# Patient Record
Sex: Female | Born: 1965 | Race: Black or African American | Hispanic: No | Smoking: Never smoker
Health system: Southern US, Community
[De-identification: ages and names within clinical notes are randomized; demographics above are authoritative.]

## PROBLEM LIST (undated history)

## (undated) DIAGNOSIS — M779 Enthesopathy, unspecified: Secondary | ICD-10-CM

## (undated) DIAGNOSIS — K219 Gastro-esophageal reflux disease without esophagitis: Secondary | ICD-10-CM

## (undated) DIAGNOSIS — Z800 Family history of malignant neoplasm of digestive organs: Secondary | ICD-10-CM

## (undated) DIAGNOSIS — F419 Anxiety disorder, unspecified: Secondary | ICD-10-CM

## (undated) DIAGNOSIS — Z8041 Family history of malignant neoplasm of ovary: Secondary | ICD-10-CM

## (undated) DIAGNOSIS — Z803 Family history of malignant neoplasm of breast: Secondary | ICD-10-CM

## (undated) DIAGNOSIS — T7840XA Allergy, unspecified, initial encounter: Secondary | ICD-10-CM

## (undated) DIAGNOSIS — Z1371 Encounter for nonprocreative screening for genetic disease carrier status: Secondary | ICD-10-CM

## (undated) HISTORY — PX: TUBAL LIGATION: SHX77

## (undated) HISTORY — DX: Family history of malignant neoplasm of breast: Z80.3

## (undated) HISTORY — DX: Allergy, unspecified, initial encounter: T78.40XA

## (undated) HISTORY — PX: CHOLECYSTECTOMY: SHX55

## (undated) HISTORY — DX: Gastro-esophageal reflux disease without esophagitis: K21.9

## (undated) HISTORY — DX: Encounter for nonprocreative screening for genetic disease carrier status: Z13.71

## (undated) HISTORY — PX: TONSILLECTOMY: SUR1361

## (undated) HISTORY — DX: Anxiety disorder, unspecified: F41.9

## (undated) HISTORY — DX: Family history of malignant neoplasm of ovary: Z80.41

## (undated) HISTORY — DX: Enthesopathy, unspecified: M77.9

## (undated) HISTORY — DX: Family history of malignant neoplasm of digestive organs: Z80.0

## (undated) HISTORY — PX: GALLBLADDER SURGERY: SHX652

## (undated) HISTORY — PX: ABDOMINAL HYSTERECTOMY: SHX81

---

## 2007-09-28 ENCOUNTER — Ambulatory Visit: Payer: Self-pay | Admitting: Otolaryngology

## 2011-02-21 HISTORY — PX: ENDOMETRIAL ABLATION: SHX621

## 2011-12-30 HISTORY — PX: LAPAROSCOPIC SUPRACERVICAL HYSTERECTOMY: SUR797

## 2012-02-19 ENCOUNTER — Ambulatory Visit: Payer: Self-pay | Admitting: Obstetrics & Gynecology

## 2012-02-19 LAB — CBC
HCT: 39.3 % (ref 35.0–47.0)
HGB: 12.5 g/dL (ref 12.0–16.0)
MCH: 27.3 pg (ref 26.0–34.0)
MCV: 86 fL (ref 80–100)
RBC: 4.58 10*6/uL (ref 3.80–5.20)
RDW: 12.6 % (ref 11.5–14.5)

## 2012-02-26 ENCOUNTER — Ambulatory Visit: Payer: Self-pay | Admitting: Obstetrics & Gynecology

## 2012-02-27 LAB — PATHOLOGY REPORT

## 2012-02-27 LAB — HEMOGLOBIN: HGB: 11 g/dL — ABNORMAL LOW (ref 12.0–16.0)

## 2013-11-11 ENCOUNTER — Emergency Department: Payer: Self-pay | Admitting: Emergency Medicine

## 2013-11-11 LAB — URINALYSIS, COMPLETE
Bilirubin,UR: NEGATIVE
Blood: NEGATIVE
Ketone: NEGATIVE
Leukocyte Esterase: NEGATIVE
Nitrite: NEGATIVE
Protein: NEGATIVE
RBC,UR: 1 /HPF (ref 0–5)
Specific Gravity: 1.018 (ref 1.003–1.030)
Squamous Epithelial: 3
WBC UR: 3 /HPF (ref 0–5)

## 2013-11-11 LAB — CBC
HCT: 40 % (ref 35.0–47.0)
HGB: 13.1 g/dL (ref 12.0–16.0)
MCHC: 32.8 g/dL (ref 32.0–36.0)
MCV: 84 fL (ref 80–100)
RBC: 4.77 10*6/uL (ref 3.80–5.20)
RDW: 13.7 % (ref 11.5–14.5)

## 2013-11-11 LAB — COMPREHENSIVE METABOLIC PANEL
Albumin: 3.6 g/dL (ref 3.4–5.0)
Alkaline Phosphatase: 109 U/L (ref 50–136)
Anion Gap: 5 — ABNORMAL LOW (ref 7–16)
BUN: 13 mg/dL (ref 7–18)
Calcium, Total: 9.5 mg/dL (ref 8.5–10.1)
Chloride: 103 mmol/L (ref 98–107)
Co2: 27 mmol/L (ref 21–32)
EGFR (Non-African Amer.): >60
Glucose: 124 mg/dL — ABNORMAL HIGH (ref 65–99)
Osmolality: 272 (ref 275–301)
Potassium: 3.8 mmol/L (ref 3.5–5.1)
SGPT (ALT): 36 U/L (ref 12–78)

## 2013-11-11 LAB — DIFFERENTIAL
Basophil #: 0 10*3/uL (ref 0.0–0.1)
Basophil %: 0.2 %
Eosinophil #: 0 10*3/uL (ref 0.0–0.7)
Lymphocyte #: 1.9 10*3/uL (ref 1.0–3.6)
Monocyte #: 1 x10 3/mm — ABNORMAL HIGH (ref 0.2–0.9)
Monocyte %: 4.6 %

## 2013-11-11 LAB — TROPONIN I: Troponin-I: <0.02 ng/mL

## 2013-11-12 LAB — GC/CHLAMYDIA PROBE AMP

## 2013-11-29 ENCOUNTER — Emergency Department: Payer: Self-pay | Admitting: Emergency Medicine

## 2013-11-29 LAB — COMPREHENSIVE METABOLIC PANEL
BUN: 12 mg/dL (ref 7–18)
Bilirubin,Total: 0.3 mg/dL (ref 0.2–1.0)
Calcium, Total: 8.6 mg/dL (ref 8.5–10.1)
Chloride: 105 mmol/L (ref 98–107)
Co2: 23 mmol/L (ref 21–32)
Creatinine: 0.65 mg/dL (ref 0.60–1.30)
EGFR (Non-African Amer.): >60
Sodium: 135 mmol/L — ABNORMAL LOW (ref 136–145)
Total Protein: 7.8 g/dL (ref 6.4–8.2)

## 2013-11-29 LAB — URINALYSIS, COMPLETE
Bacteria: NONE SEEN
Bilirubin,UR: NEGATIVE
Blood: NEGATIVE
Ketone: NEGATIVE
Ph: 8 (ref 4.5–8.0)
Protein: 30
Specific Gravity: 1.028 (ref 1.003–1.030)
Squamous Epithelial: 8
WBC UR: 4 /HPF (ref 0–5)

## 2013-11-29 LAB — CBC
HCT: 36.5 % (ref 35.0–47.0)
HGB: 12.1 g/dL (ref 12.0–16.0)
MCHC: 33 g/dL (ref 32.0–36.0)
MCV: 84 fL (ref 80–100)
RBC: 4.36 10*6/uL (ref 3.80–5.20)
RDW: 14 % (ref 11.5–14.5)
WBC: 12.9 10*3/uL — ABNORMAL HIGH (ref 3.6–11.0)

## 2013-11-29 LAB — LIPASE, BLOOD: Lipase: 189 U/L (ref 73–393)

## 2013-11-30 ENCOUNTER — Ambulatory Visit: Payer: Self-pay | Admitting: Surgery

## 2013-12-01 LAB — PATHOLOGY REPORT

## 2013-12-29 DIAGNOSIS — F419 Anxiety disorder, unspecified: Secondary | ICD-10-CM

## 2013-12-29 HISTORY — DX: Anxiety disorder, unspecified: F41.9

## 2014-09-29 ENCOUNTER — Encounter: Payer: Self-pay | Admitting: Podiatry

## 2014-09-29 ENCOUNTER — Ambulatory Visit (INDEPENDENT_AMBULATORY_CARE_PROVIDER_SITE_OTHER): Payer: BC Managed Care – PPO

## 2014-09-29 ENCOUNTER — Ambulatory Visit: Payer: Self-pay | Admitting: Podiatry

## 2014-09-29 ENCOUNTER — Ambulatory Visit (INDEPENDENT_AMBULATORY_CARE_PROVIDER_SITE_OTHER): Payer: BC Managed Care – PPO | Admitting: Podiatry

## 2014-09-29 VITALS — BP 107/63 | HR 87 | Resp 16 | Ht 61.0 in | Wt 233.0 lb

## 2014-09-29 DIAGNOSIS — M722 Plantar fascial fibromatosis: Secondary | ICD-10-CM

## 2014-09-29 DIAGNOSIS — M7661 Achilles tendinitis, right leg: Secondary | ICD-10-CM

## 2014-09-29 MED ORDER — TRIAMCINOLONE ACETONIDE 10 MG/ML IJ SUSP
10.0000 mg | Freq: Once | INTRAMUSCULAR | Status: AC
  Administered 2014-09-29: 10 mg

## 2014-09-29 NOTE — Progress Notes (Signed)
Subjective:     Patient ID: Kristin Lynch, female   DOB: [birth year], 48 y.o.   MRN: [id number]  Foot Pain   patient presents stating I'm having a lot of pain in my right heel that's been present for a while and has worsened over the last month. States it's constant and that she's not sure where it has come from   Review of Systems  All other systems reviewed and are negative.      Objective:   Physical Exam  Nursing note and vitals reviewed. Constitutional: She is oriented to person, place, and time.  Cardiovascular: Intact distal pulses.   Musculoskeletal: Normal range of motion.  Neurological: She is oriented to person, place, and time.  Skin: Skin is warm.   neurovascular status is intact with muscle strength adequate and range of motion subtalar midtarsal joint was within normal limits. No equinus condition was noted and the Achilles tendon is functioning well on both right and left foot. Digits are well-perfused and arch height is normal upon weightbearing and I noted exquisite discomfort in the posterior lateral aspect of the right heel at the insertion of the Achilles tendon     Assessment:     Achilles tendinitis right with inflammation mostly in the lateral portion of the tendon    Plan:     H&P and x-rays reviewed with patient. I discussed injection therapy with immobilization and explained the risk of the procedure. Patient wants to procedure done and today I carefully injected the lateral side 3 mg Kenalog 5 mg Xylocaine and applied air fracture walker with instructions on usage. Placed on diclofenac 75 mg twice a day and reappoint in 3 weeks

## 2014-09-29 NOTE — Patient Instructions (Addendum)

## 2014-09-29 NOTE — Progress Notes (Signed)
   Subjective:    Patient ID: Kristin Lynch, female    DOB: 1966-01-23, 48 y.o.   MRN: [id number]  HPI Comments: Been having trouble with the right heel for some time now, but the last month it has got worse, its more constant.  Right lateral side of the foot has been bothering me. If moving a certain way it stops me in my tracks.   Foot Pain      Review of Systems  All other systems reviewed and are negative.      Objective:   Physical Exam        Assessment & Plan:

## 2014-10-02 ENCOUNTER — Telehealth: Payer: Self-pay | Admitting: *Deleted

## 2014-10-02 NOTE — Telephone Encounter (Signed)
I'm calling about a prescription, I was there Friday and saw Dr. Paulla Dolly. He was calling an a non-inflammatory medication for me to [facility] on [address]. When I got there on Saturday they didn't have a record of it being there. If you guys would let me know what I need to do to go ahead and get that there and get it picked up. Thank you.   Told pt i would call her once rx has been sent over. Pt understood.

## 2014-10-02 NOTE — Telephone Encounter (Signed)
I'm calling about a prescription, I was there Friday and saw Dr. Paulla Dolly. He was calling an a non-inflammatory medication for me to [facility] on [address]. When I got there on Saturday they didn't have a record of it being there. If you guys would let me know what I need to do to go ahead and get that there and get it picked up. Thank you.

## 2014-10-02 NOTE — Telephone Encounter (Signed)
I'm calling about a prescription, I was there Friday and saw Dr. Paulla Dolly.  He was calling an a non-inflammatory medication for me to [facility] on [address].  When I got there on Saturday they didn't have a record of it being there.  If you guys would let me know what I need to do to go ahead and get that there and get it picked up.  Thank you.

## 2014-10-03 ENCOUNTER — Other Ambulatory Visit: Payer: Self-pay | Admitting: *Deleted

## 2014-10-03 ENCOUNTER — Telehealth: Payer: Self-pay | Admitting: *Deleted

## 2014-10-03 MED ORDER — DICLOFENAC SODIUM 75 MG PO TBEC: 75.0000 mg | DELAYED_RELEASE_TABLET | Freq: Two times a day (BID) | ORAL | Status: DC

## 2014-10-03 NOTE — Telephone Encounter (Signed)
Pt called wanting to know if dr regal had sent her rx over. Told pt dr Paulla Dolly had not sent rx over and we will call her once he does so. Pt understood.

## 2014-10-03 NOTE — Telephone Encounter (Signed)
Called and spoke with pt letting her know diclofenac was sent to pharmacy. Pt understood.

## 2014-10-03 NOTE — Telephone Encounter (Signed)
Check her chart and lets talk tomorrow about which. Probably diclofenec

## 2014-10-06 NOTE — Telephone Encounter (Signed)
diclofence #[id number]

## 2014-10-17 ENCOUNTER — Ambulatory Visit (INDEPENDENT_AMBULATORY_CARE_PROVIDER_SITE_OTHER): Payer: BC Managed Care – PPO | Admitting: Podiatry

## 2014-10-17 DIAGNOSIS — M7661 Achilles tendinitis, right leg: Secondary | ICD-10-CM

## 2014-10-17 DIAGNOSIS — M79609 Pain in unspecified limb: Secondary | ICD-10-CM

## 2014-10-18 NOTE — Progress Notes (Signed)
Subjective:     Patient ID: Kristin Lynch, female   DOB: 08-28-1966, 48 y.o.   MRN: [id number]  HPI patient presents stating my foot is feeling a lot better with mild discomfort when pressed deep   Review of Systems     Objective:   Physical Exam Neurovascular status intact with significant diminishment of discomfort in the plantar foot heel with palpation and ambulation    Assessment:     Improved fasciitis tendinitis    Plan:     Discussed Achilles tendon and fasciitis-like exercises along with ice and dispensed heel cups to lift the heels and continued diclofenac for several more weeks. Reappoint if symptoms persist

## 2014-10-20 ENCOUNTER — Ambulatory Visit: Payer: BC Managed Care – PPO | Admitting: Podiatry

## 2015-04-20 NOTE — Op Note (Signed)
PATIENT NAME:  Kristin Lynch, Kristin Lynch MR#:  [id number] DATE OF BIRTH:  02-23-1966  DATE OF PROCEDURE:  11/30/2013  PREOPERATIVE DIAGNOSIS: Symptomatic cholelithiasis.   POSTOPERATIVE DIAGNOSIS: Symptomatic cholelithiasis.   PROCEDURE PERFORMED: Laparoscopic cholecystectomy.   ESTIMATED BLOOD LOSS:  5 mL.   COMPLICATIONS: None.   SPECIMEN: Gallbladder.   INDICATION FOR SURGERY:  Kristin Lynch is a pleasant 49 year old female who presented with recurrent right upper quadrant pain, particularly with fatty food. She was noted to have gallstones. I felt her presentation was consistent with symptomatic cholelithiasis.   DETAILS OF PROCEDURE: After informed consent was obtained, Kristin Lynch was brought to the operating room suite. She was induced. Endotracheal tube was placed. General anesthesia was administered. Her abdomen was prepped and draped in standard surgical fashion. A timeout was then performed correctly identifying the patient name, operative site and procedure to be performed. A supraumbilical incision was made. It was deepened down to the fascia and the fascia was incised. The peritoneum was entered. Two stay sutures were placed through the fascia.  A hassan trocar was placed through the fasciotomy into the abdomen. The abdomen was insufflated. An 11 mm epigastric trocar and two 5 mm subcostal trocars were placed at the midclavicular and anterior axillary line. The gallbladder was then lifted up over the dome of the liver. There were some adhesions that needed to be taken off the gallbladder. These were taken down bluntly, and the cystic artery and cystic duct were dissected out. The critical view was obtained. The cystic duct was clipped 3 times and ligated. Likewise, the cystic artery was clipped 3 times and ligated. The gallbladder was then taken off the gallbladder fossa using cautery. The gallbladder was then taken out through an Endo Catch bag through the supraumbilical port. The gallbladder fossa  was examined and noted to be hemostatic. The abdomen was then irrigated with normal saline to rid of any bile. The trocars were then taken out under direct visualization. The abdomen was desufflated. The 0 Vicryl figure-of-eight was used to close the supraumbilical fascia. The wounds were then closed with 4-0 Monocryl deep dermal sutures. Steri-Strips, Telfa gauze and Tegaderm were then used to complete the dressing. The patient was then awoken, extubated and brought to the postanesthesia care unit. There were no immediate complications. Needle, sponge and instrument counts were correct at the end of the procedure.    ____________________________ Glena Norfolk. Nonnie Pickney, MD cal:dmm D: 11/30/2013 11:14:04 ET T: 11/30/2013 11:23:39 ET JOB#: [id number]  cc: Harrell Gave A. Bence Trapp, MD, <Dictator> Floyde Parkins MD ELECTRONICALLY SIGNED 12/08/2013 11:39

## 2015-04-22 NOTE — Op Note (Signed)
PATIENT NAME:  Kristin Lynch, Kristin Lynch MR#:  [id number] DATE OF BIRTH:  06/10/1966  DATE OF PROCEDURE:  02/26/2012  PREOPERATIVE DIAGNOSES: Pelvic pain and menorrhagia.   POSTOPERATIVE DIAGNOSIS: Pelvic pain and menorrhagia.   PROCEDURE: Laparoscopic supracervical hysterectomy.   SURGEON: Barnett Applebaum, M.D.   ASSISTANT: Verlene Mayer M.D.   ANESTHESIA: General.   ESTIMATED BLOOD LOSS: 50 mL.   COMPLICATIONS: None.   FINDINGS: Somewhat enlarged boggy uterus, normal ovaries. There were adhesions in the left adnexal region.   DISPOSITION: To the Recovery Room in stable condition.   TECHNIQUE: The patient is prepped and draped in the usual sterile fashion after adequate anesthesia is obtained, in the dorsal lithotomy position. Foley catheter is inserted along with the sponge stick per vagina for manipulation purposes. Attention is then turned to the abdomen where a Veress needle is inserted through a 5 mm infraumbilical incision after Marcaine is used to anesthetize the skin. Veress needle placement is confirmed using the hanging drop technique and the abdomen is then insufflated with CO2 gas. A 5 mm trocar is then inserted under direct visualization with the laparoscope with no injuries or bleeding noted. The patient is placed in Trendelenburg positioning. A 5 mm trocar is placed in the left lower quadrant and an 11 mm trocar is placed in the right lower quadrant lateral to the inferior epigastric blood vessels with no injuries or bleeding noted. The uterus is grasped with a tenaculum and the uterine ovarian blood vessels and ligaments are carefully coagulated and cut using the Harmonic scalpel with additional cautery using the bipolar cautery device with preservation of the ovary and its main blood supply. The dissection is carried down to the level of the uterine arteries, which are carefully coagulated and cut. Adhesions in the left adnexa are dissected for easier visualization and completion of the  procedure. The uterus is then amputated with approximately 1 cm of cervix left. Hemostasis is assured using Bovie electrocautery. The pelvic cavity is irrigated with saline, and the endocervical canal is cauterized.   A morcellator device is placed over the right lower quadrant incision and the uterus is removed by morcellation process without complication. Repeat examination of the pelvis reveals excellent hemostasis and no apparent injury to bowel, ureter, or other structures. Interceed is placed over the cervical remnant to minimize adhesion formation. The right lower quadrant incision is closed with a 0 Vicryl suture, using a fascial closure device, gas is expelled, and trocars are removed, and the skin is closed with Dermabond. Sponge stick is removed from the vagina, and the Foley catheter is left in place. The patient goes to the Recovery Room in stable condition having tolerated the procedure well. All sponge, instrument, and needle counts are correct. ____________________________ R. Barnett Applebaum, MD rph:slb D: 02/26/2012 12:38:04 ET T: 02/26/2012 12:45:30 ET JOB#: [id number]  cc: Glean Salen, MD, <Dictator> Gae Dry MD ELECTRONICALLY SIGNED 02/26/2012 14:11

## 2015-11-02 ENCOUNTER — Ambulatory Visit (INDEPENDENT_AMBULATORY_CARE_PROVIDER_SITE_OTHER): Payer: BLUE CROSS/BLUE SHIELD | Admitting: Sports Medicine

## 2015-11-02 ENCOUNTER — Encounter: Payer: Self-pay | Admitting: Sports Medicine

## 2015-11-02 DIAGNOSIS — M79672 Pain in left foot: Secondary | ICD-10-CM | POA: Diagnosis not present

## 2015-11-02 DIAGNOSIS — M216X1 Other acquired deformities of right foot: Secondary | ICD-10-CM | POA: Diagnosis not present

## 2015-11-02 DIAGNOSIS — M79671 Pain in right foot: Secondary | ICD-10-CM

## 2015-11-02 DIAGNOSIS — M7662 Achilles tendinitis, left leg: Secondary | ICD-10-CM | POA: Diagnosis not present

## 2015-11-02 DIAGNOSIS — M7661 Achilles tendinitis, right leg: Secondary | ICD-10-CM

## 2015-11-02 DIAGNOSIS — M216X2 Other acquired deformities of left foot: Secondary | ICD-10-CM | POA: Diagnosis not present

## 2015-11-02 MED ORDER — METHYLPREDNISOLONE 4 MG PO TBPK: ORAL_TABLET | ORAL | Status: DC

## 2015-11-02 NOTE — Progress Notes (Signed)
Patient ID: Kristin Lynch, female   DOB: 02-25-1966, 49 y.o.   MRN: [id number] Subjective: Kristin Lynch is a 49 y.o. female patient who presents to office for evaluation of Right> Left foot pain. Patient complains of progressive pain especially over the last year in the Right>Left foot at the Achilles. Patient has tried injections, heel cups/lifts, diclofenac (adverse reaction of itchiness), CAM walker, & ice with no relief in symptoms. Reports that right heel has been bothersome for 1 year and left heel for a few months; starts as a dull ache made worse with walking and better with high heels. Patient denies any other pedal complaints.   There are no active problems to display for this patient.  Current Outpatient Prescriptions on File Prior to Visit  Medication Sig Dispense Refill  . diclofenac (VOLTAREN) 75 MG EC tablet Take 1 tablet (75 mg total) by mouth 2 (two) times daily. 60 tablet 1  . fluticasone (FLONASE) 50 MCG/ACT nasal spray Place into both nostrils daily.     No current facility-administered medications on file prior to visit.   No Known Allergies   Objective:  General: Alert and oriented x3 in no acute distress  Dermatology: No open lesions bilateral lower extremities, no webspace macerations, no ecchymosis bilateral, all nails x 10 are well manicured.  Vascular: Dorsalis Pedis and Posterior Tibial pedal pulses 2/4, Capillary Fill Time 3 seconds, + pedal hair growth bilateral, no edema bilateral lower extremities, Temperature gradient within normal limits.  Neurology: Johney Maine sensation intact via light touch bilateral, Protective sensation intact  with Thornell Mule Monofilament to all pedal sites, Position sense intact, vibratory intact bilateral, Deep tendon reflexes within normal limits bilateral, No babinski sign present bilateral. - Tinels sign bilateral.   Musculoskeletal: Mild tenderness with palpation at insertion of the Achilles on Right>Left, there is  calcaneal exostosis with mild soft tissue present and decreased ankle rom with knee extending  vs flexed resembling gastroc equnius bilateral, The achilles tendon feels intact with no nodularity or palpable dell, Thompson sign negative, Subtalar and midtarsal joint range of motion is within normal limits, there is no 1st ray hypermobility or forefoot deformity noted bilateral.   Gait: Antalgic gait with increased heel off right>left.   Assessment and Plan: Problem List Items Addressed This Visit    None    Visit Diagnoses    Achilles tendonitis, bilateral    -  Primary    Right >1 year, Left a few months    Relevant Medications    methylPREDNISolone (MEDROL DOSEPAK) 4 MG TBPK tablet    Acquired equinus deformity of both feet        Heel pain, bilateral        Relevant Medications    methylPREDNISolone (MEDROL DOSEPAK) 4 MG TBPK tablet      -Complete examination performed -Previous Xrays reviewed -Discussed treatement options -Rx Medrol dose pack once complete to start Aleve or motrin as needed -Dispensed night splint of which she will start using. -Continue with heel lifts, gentle stretching, ice, and CAM walker as needed for acute exacerbations.  -If no improvement will consider repeat xrays/MRI/PT/EPAT -Patient to return to office in 3 weeks or sooner if condition worsens.  Landis Martins, DPM

## 2015-11-02 NOTE — Patient Instructions (Signed)
Achilles Tendon Rupture With Phase I Rehab A complete tear in the Achilles tendon is known as an Achilles tendon rupture. The Achilles tendon, which is also known as the heel cord, connects the large calf muscles (gastrocnemius and soleus) to the heel bone (calcaneus) and is essential for proper functioning of the calf muscles. The calf muscles are required for pushing the foot downward and are necessary for walking, running, and jumping. SYMPTOMS   A "pop" or tear may be felt at the time of injury.  Pain and weakness during movement, especially when raising the heel.  Tenderness, swelling, warmth, and redness around the Achilles tendon.  Bruising (contusion) around the back of the ankle after 48 hours.  Loss of firmness to the touch over the area of the Achilles tendon that ruptured. CAUSES   Achilles tendon rupture is most commonly caused by a sudden force placed upon the Achilles tendon that is greater than the tendon can withstand (for example jumping, hurdling, or sprinting).  Achilles tendon rupture may also occur from direct trauma or injury to the lower leg, foot, or ankle. RISK INCREASES WITH:  Physical activity that involves sudden muscle contraction.  Poor strength and flexibility of the lower leg.  Previous injury to the tendon.  Untreated Achilles tendinitis.  Steroid injection into the Achilles tendon.  Medical conditions, such as poor circulation due to a cardiovascular condition or obesity. PREVENTION   Include a proper warm-up and stretching routine before physical activity.  Allow for rest and recovery between physical activity.  Maintain lower leg fitness:  Flexibility.  Muscle strength.  Muscular endurance.  Cardiovascular fitness.  Mechanical prevention:  Taping.  Protective strapping.  An adhesive bandaging that has been recommended prior to physical activity. TREATMENT  Initially one should not walk on the affected leg. One should also ice  the injured area, apply compression with an elastic bandage, and elevate the injured leg to eye level. Both surgical and nonsurgical interventions exist for definitive treatment. The return to sports is usually about the same with either treatment course but can occur a few weeks sooner with surgery.   Nonsurgical treatment typically requires the immobilization in the form of a long leg cast (from toes to groin) for 4 to 9 weeks. The long leg cast is followed by a short leg cast or walking boot for an additional 4 to 12 weeks.  Advantages: Non-surgical treatment lacks the risks involved with anesthesia or surgery (such as infection, bleeding, or injury to nerves).  Disadvantages: Non-surgical treatment involves a longer period of immobilization. This may result in stiffer ankle and knee joints. Also, the calf muscles are slightly weaker and the risk of repeat rupture is higher.  Surgical treatment typically requires sewing the ends of the tendon back together, followed by immobilization (typically a short leg cast or walking boot).  Advantages: Surgical treatment does not usually require the immobilization of the knee. Surgery also offers a lower risk of repeat rupture of the tendon and slightly stronger calf muscles.  Disadvantages: Surgical treatment does include the risks of anesthesia and surgery. These risks include impaired wound healing and injury to a nerve that provides sensation to the side of the foot. PROGNOSIS   Achilles tendon ruptures are typically curable if treated correctly.  A period of 4 to 9 months is typical before a return to sports. RELATED COMPLICATIONS   Calf muscle weakness may occur, especially if the rupture goes untreated.  The possibility of repeat rupture exists despite treatment.  Prolonged disability can occur.  Risks of surgery include infection, bleeding, injury to nerves, and impaired wound healing. MEDICATION   If pain medication is necessary, then  nonsteroidal anti-inflammatory medications, such as aspirin and ibuprofen, or other minor pain relievers, such as acetaminophen, are often recommended.  Do not take pain medication within 7 days before surgery.  All medications should be taken under the direction of your caregiver. Contact your caregiver immediately if any bleeding, stomach upset, or signs of allergic reaction occur.  Prescription pain medication may be prescribed as necessary by your caregiver. Use only as directed and only as much as you need. SEEK MEDICAL CARE IF:   Pain continues to increase, despite treatment.  Cast discomfort develops.  New, unexplained symptoms develop.  You are experiencing any side effects form the drugs used in treatment. EXERCISES  PHASE I EXERCISES RANGE OF MOTION (ROM) AND STRETCHING EXERCISES - Achilles Tendon Rupture Phase I  These exercises will help you begin to restore your ankle flexibility in the first 2 to 4 weeks after your cast is removed. Your physician, physical therapist, or athletic trainer will progress your exercise once you have demonstrated the sufficient gains in flexibility and strength. While completing these exercises, remember:   Restoring tissue flexibility helps normal motion to return to the joints. This allows healthier, less painful movement and activity.  An effective stretch should be held for at least 30 seconds.  A stretch should never be painful. You should only feel a gentle lengthening or release in the stretched tissue. RANGE OF MOTION - Dorsi/Plantar Flexion  While sitting with your right / left knee straight, draw the top of your foot upwards by flexing your ankle. Then reverse the motion, pointing your toes downward.  Hold each position for __________ seconds.  After completing your first set of exercises, repeat this exercise with your knee bent. Repeat __________ times. Complete this exercise __________ times per day.  RANGE OF MOTION - Ankle  Alphabet  Imagine your right / left big toe is a pen.  Keeping your hip and knee still, write out the entire alphabet with your "pen." Make the letters as large as you can without increasing any discomfort. Repeat __________ times. Complete this exercise __________ times per day.  RANGE OF MOTION - Ankle Dorsiflexion, Active Assisted   Remove shoes and sit on a chair that is preferably not on a carpeted surface.  Place right / left foot under knee. Extend your opposite leg for support.  Keeping your heel down, slide your right / left foot back toward the chair until you feel a stretch at your ankle or calf. If you do not feel a stretch, slide your bottom forward to the edge of the chair, while still keeping your heel down.  Hold this stretch for __________ seconds. Repeat __________ times. Complete this stretch __________ times per day.  STRETCH - Gastrocsoleus   Sit with your right / left leg extended. Holding onto both ends of a belt or towel, loop it around the ball of your foot.  Keeping your right / left ankle and foot relaxed and your knee straight, pull your foot and ankle toward you using the belt/towel.  You should feel a gentle stretch behind your calf or knee. Hold this position for __________ seconds. Repeat __________ times. Complete this stretch __________ times per day.  STRENGTHENING EXERCISES - Achilles Tendon Rupture Phase I  These exercises will help you begin to restore your ankle strength the first 2 to  4 weeks after your cast is removed. Your physician, physical therapist, or athletic trainer will progress your exercise once you have demonstrated the sufficient gains in flexibility and strength. While completing these exercises, remember:   Muscles can gain both the endurance and the strength needed for everyday activities through controlled exercises.  Complete these exercises as instructed by your physician, physical therapist, or athletic trainer. Progress the  resistance and repetitions only as guided.  You may experience muscle soreness or fatigue, but the pain or discomfort you are trying to eliminate should never worsen during these exercises. If this pain does worsen, stop and make certain you are following the directions exactly. If the pain is still present after adjustments, discontinue the exercise until you can discuss the trouble with your clinician. STRENGTH - Dorsiflexors  Secure a rubber exercise band/tubing to a fixed object (for example a table or pole) and loop the other end around your right / left foot.  Sit on the floor facing the fixed object. The band/tubing should be slightly tense when your foot is relaxed.  Slowly draw your foot back toward you using your ankle and toes.  Hold this position for __________ seconds. Slowly release the tension in the band and return your foot to the starting position. Repeat __________ times. Complete this exercise __________ times per day.  STRENGTH - Plantar-flexors   Sit with your right / left leg extended. Holding onto both ends of a rubber exercise band/tubing, loop it around the ball of your foot. Keep a slight tension in the band.  Slowly push your toes away from you, pointing them downward.  Hold this position for __________ seconds. Return slowly, controlling the tension in the band/tubing. Repeat __________ times. Complete this exercise __________ times per day.  STRENGTH - Towel Curls  Sit in a chair positioned on a non-carpeted surface.  Place your foot on a towel, keeping your heel on the floor.  Pull the towel toward your heel by only curling your toes. Keep your heel on the floor.  If instructed by your physician, physical therapist or athletic trainer, add weight to the end of the towel. Repeat __________ times. Complete this exercise __________ times per day. STRENGTH - Ankle Eversion   Secure one end of a rubber exercise band/tubing to a fixed object (table, pole).  Loop the other end around your foot just before your toes.  Place your fists between your knees. This will focus your strengthening at your ankle.  Drawing the band/tubing across your opposite foot, slowly, pull your little toe out and up. Make sure the band/tubing is positioned to resist the entire motion.  Hold this position for __________ seconds.  Have your muscles resist the band/tubing as it slowly pulls your foot back to the starting position. Repeat __________ times. Complete this exercise __________ times per day.  STRENGTH - Ankle Inversion  Secure one end of a rubber exercise band/tubing to a fixed object (table, pole). Loop the other end around your foot just before your toes.  Place your fists between your knees. This will focus your strengthening at your ankle.  Slowly, pull your big toe up and in, making sure the band/tubing is positioned to resist the entire motion.  Hold this position for __________ seconds.  Have your muscles resist the band/tubing as it slowly pulls your foot back to the starting position. Repeat __________ times. Complete this exercises __________ times per day.  STRENGTH - Plantar Flexors, Seated   Sit on a chair  that allows your feet to rest flat on the ground. If necessary, sit at the edge of the chair.  Keeping your toes firmly on the ground, lift your right / left heel as far as you can without increasing any discomfort in your ankle. Repeat __________ times. Complete this exercise __________ times a day. *If instructed by your physician, physical therapist, or athletic trainer, you may add ____________________ of resistance by placing a weighted object on your knee.   This information is not intended to replace advice given to you by your health care provider. Make sure you discuss any questions you have with your health care provider.   Document Released: 12/15/2005 Document Revised: 05/01/2015 Document Reviewed: 03/29/2009 [employer]  Interactive Patient Education [employer].

## 2015-11-20 ENCOUNTER — Ambulatory Visit (INDEPENDENT_AMBULATORY_CARE_PROVIDER_SITE_OTHER): Payer: BLUE CROSS/BLUE SHIELD | Admitting: Sports Medicine

## 2015-11-20 ENCOUNTER — Encounter: Payer: Self-pay | Admitting: Sports Medicine

## 2015-11-20 DIAGNOSIS — M7662 Achilles tendinitis, left leg: Secondary | ICD-10-CM | POA: Diagnosis not present

## 2015-11-20 DIAGNOSIS — M79672 Pain in left foot: Secondary | ICD-10-CM | POA: Diagnosis not present

## 2015-11-20 DIAGNOSIS — M216X1 Other acquired deformities of right foot: Secondary | ICD-10-CM

## 2015-11-20 DIAGNOSIS — M216X2 Other acquired deformities of left foot: Secondary | ICD-10-CM

## 2015-11-20 DIAGNOSIS — M7661 Achilles tendinitis, right leg: Secondary | ICD-10-CM | POA: Diagnosis not present

## 2015-11-20 DIAGNOSIS — M79671 Pain in right foot: Secondary | ICD-10-CM | POA: Diagnosis not present

## 2015-11-20 NOTE — Progress Notes (Signed)
Patient ID: Kristin Lynch, female   DOB: Jun 15, 1966, 49 y.o.   MRN: [id number]  Subjective: Kristin Lynch is a 49 y.o. female patient who returns to office for evaluation of Right> Left foot pain. Patient reports that the pain is better in Left however there is residual pain in the right with a little bit of lateral heel numbness possibly related to night splint use. Patient was able to tolerate Steroid dose pack and Voltaren with no problems; admits to irritability that improved when finished the dose pack. Patient denies any other pedal complaints.   There are no active problems to display for this patient.  Current Outpatient Prescriptions on File Prior to Visit  Medication Sig Dispense Refill  . diclofenac (VOLTAREN) 75 MG EC tablet Take 1 tablet (75 mg total) by mouth 2 (two) times daily. 60 tablet 1  . fluticasone (FLONASE) 50 MCG/ACT nasal spray Place into both nostrils daily.     No current facility-administered medications on file prior to visit.   No Known Allergies   Objective:  General: Alert and oriented x3 in no acute distress  Dermatology: No open lesions bilateral lower extremities, no webspace macerations, no ecchymosis bilateral, all nails x 10 are well manicured.  Vascular: Dorsalis Pedis and Posterior Tibial pedal pulses 2/4, Capillary Fill Time 3 seconds, + pedal hair growth bilateral, no edema bilateral lower extremities, Temperature gradient within normal limits.  Neurology: Johney Maine sensation intact via light touch bilateral, Protective sensation intact  with Thornell Mule Monofilament to all pedal sites, Position sense intact, vibratory intact bilateral, Deep tendon reflexes within normal limits bilateral, No babinski sign present bilateral. - Tinels sign bilateral.   Musculoskeletal: Decreased tenderness with palpation at insertion of the Achilles on Left>Right, there is calcaneal exostosis with mild soft tissue present and decreased ankle rom with knee  extending  vs flexed resembling gastroc equnius bilateral, The achilles tendon feels intact with no nodularity or palpable dell, Thompson sign negative, Subtalar and midtarsal joint range of motion is within normal limits, there is no 1st ray hypermobility or forefoot deformity noted bilateral.   Gait: Non-Antalgic gait with increased heel off right>left.   Assessment and Plan: Problem List Items Addressed This Visit    None    Visit Diagnoses    Achilles tendonitis, bilateral    -  Primary    R>L    Acquired equinus deformity of both feet        Heel pain, bilateral          -Complete examination performed -Discussed treatement options  -Continue night splint with use every other night to prevent transient neuritis at right heel -Continue with heel lifts, gentle stretching, ice, and CAM walker as needed for acute exacerbations. -Rx PT with range and topical modalities at [facility].   -If no improvement will consider repeat xrays/MRI/EPAT -Patient to return to office in 3 weeks or sooner if condition worsens.  Landis Martins, DPM

## 2015-12-11 ENCOUNTER — Ambulatory Visit: Payer: BLUE CROSS/BLUE SHIELD | Admitting: Sports Medicine

## 2016-01-04 ENCOUNTER — Encounter: Payer: Self-pay | Admitting: Sports Medicine

## 2016-01-04 ENCOUNTER — Ambulatory Visit (INDEPENDENT_AMBULATORY_CARE_PROVIDER_SITE_OTHER): Payer: BLUE CROSS/BLUE SHIELD | Admitting: Sports Medicine

## 2016-01-04 DIAGNOSIS — M7661 Achilles tendinitis, right leg: Secondary | ICD-10-CM | POA: Diagnosis not present

## 2016-01-04 DIAGNOSIS — M79671 Pain in right foot: Secondary | ICD-10-CM

## 2016-01-04 DIAGNOSIS — M216X2 Other acquired deformities of left foot: Secondary | ICD-10-CM | POA: Diagnosis not present

## 2016-01-04 DIAGNOSIS — M7662 Achilles tendinitis, left leg: Secondary | ICD-10-CM

## 2016-01-04 DIAGNOSIS — M779 Enthesopathy, unspecified: Secondary | ICD-10-CM

## 2016-01-04 DIAGNOSIS — M216X1 Other acquired deformities of right foot: Secondary | ICD-10-CM | POA: Diagnosis not present

## 2016-01-04 DIAGNOSIS — M79672 Pain in left foot: Secondary | ICD-10-CM

## 2016-01-04 MED ORDER — TRIAMCINOLONE ACETONIDE 10 MG/ML IJ SUSP: 10.0000 mg | Freq: Once | INTRAMUSCULAR | Status: DC

## 2016-01-04 MED ORDER — DICLOFENAC SODIUM 75 MG PO TBEC: 75.0000 mg | DELAYED_RELEASE_TABLET | Freq: Two times a day (BID) | ORAL | Status: DC

## 2016-01-04 NOTE — Progress Notes (Signed)
Patient ID: Kristin Lynch, female   DOB: 1966/02/26, 50 y.o.   MRN: [id number]  Subjective: Kristin Lynch is a 50 y.o. female patient who returns to office for evaluation of Bilateral foot pain at heels; States that the right side was worse and is doing much better after therapy however there is still residual pain in the left side; states that she feels like therapy helped a lot. Patient states that she aggerviated her heels on yesterday by wearing a pair of boots that were to tight with orthotic in them. Patient denies any other pedal complaints.   There are no active problems to display for this patient.  Current Outpatient Prescriptions on File Prior to Visit  Medication Sig Dispense Refill  . fluticasone (FLONASE) 50 MCG/ACT nasal spray Place into both nostrils daily.     No current facility-administered medications on file prior to visit.   No Known Allergies   Objective:  General: Alert and oriented x3 in no acute distress  Dermatology: No open lesions bilateral lower extremities, no webspace macerations, no ecchymosis bilateral, all nails x 10 are well manicured.  Vascular: Dorsalis Pedis and Posterior Tibial pedal pulses 2/4, Capillary Fill Time 3 seconds, + pedal hair growth bilateral, no edema bilateral lower extremities, Temperature gradient within normal limits.  Neurology: Johney Maine sensation intact via light touch bilateral, Protective sensation intact  with Thornell Mule Monofilament to all pedal sites, Position sense intact, vibratory intact bilateral, Deep tendon reflexes within normal limits bilateral, No babinski sign present bilateral. - Tinels sign bilateral.   Musculoskeletal: Decreased tenderness with palpation at insertion of the Achilles on Right>Left, New pain at peroneal tendons bilateral with deep palpation, there is calcaneal exostosis with mild soft tissue present and decreased ankle rom with knee extending  vs flexed resembling gastroc equnius bilateral,  The achilles tendon feels intact with no nodularity or palpable dell, Thompson sign remains negative, Subtalar and midtarsal joint range of motion is within normal limits, there is no 1st ray hypermobility or forefoot deformity noted bilateral.  Assessment and Plan: Problem List Items Addressed This Visit    None    Visit Diagnoses    Achilles tendonitis, bilateral    -  Primary    Relevant Medications    diclofenac (VOLTAREN) 75 MG EC tablet    Acquired equinus deformity of both feet        Relevant Medications    diclofenac (VOLTAREN) 75 MG EC tablet    Heel pain, bilateral        Relevant Medications    diclofenac (VOLTAREN) 75 MG EC tablet    Tendonitis        Relevant Medications    diclofenac (VOLTAREN) 75 MG EC tablet    triamcinolone acetonide (KENALOG) 10 MG/ML injection 10 mg (Start on 01/04/2016  9:45 AM)      -Complete examination performed -Discussed treatement options  -After oral consent and aseptic prep, injected a mixture containing 1 ml of 2%  plain lidocaine, 1 ml 0.5% plain marcaine, 0.5 ml of kenalog 10 and 0.5 ml of dexamethasone phosphate into peroneal tendon sheath bilateral. Post-injection care discussed with patient.  -Refilled Diclofenac -Continue night splint with use every other night  -Continue with heel lifts, gentle stretching, ice, and good supportive shoes. Advised use of CAM walker as needed for acute exacerbations.  -If no improvement will consider repeat xrays/MRI/EPAT; gave brochure -Patient to return to office in 3 weeks or sooner if condition worsens.  Landis Martins, DPM

## 2016-01-25 ENCOUNTER — Ambulatory Visit: Payer: BLUE CROSS/BLUE SHIELD | Admitting: Sports Medicine

## 2017-11-28 DIAGNOSIS — Z8041 Family history of malignant neoplasm of ovary: Secondary | ICD-10-CM

## 2017-11-28 DIAGNOSIS — Z803 Family history of malignant neoplasm of breast: Secondary | ICD-10-CM

## 2017-11-28 DIAGNOSIS — Z1371 Encounter for nonprocreative screening for genetic disease carrier status: Secondary | ICD-10-CM

## 2017-11-28 DIAGNOSIS — Z800 Family history of malignant neoplasm of digestive organs: Secondary | ICD-10-CM

## 2017-11-28 HISTORY — DX: Family history of malignant neoplasm of digestive organs: Z80.0

## 2017-11-28 HISTORY — DX: Family history of malignant neoplasm of ovary: Z80.41

## 2017-11-28 HISTORY — DX: Encounter for nonprocreative screening for genetic disease carrier status: Z13.71

## 2017-11-28 HISTORY — DX: Family history of malignant neoplasm of breast: Z80.3

## 2017-12-18 ENCOUNTER — Encounter: Payer: Self-pay | Admitting: Certified Nurse Midwife

## 2017-12-18 ENCOUNTER — Ambulatory Visit (INDEPENDENT_AMBULATORY_CARE_PROVIDER_SITE_OTHER): Payer: BLUE CROSS/BLUE SHIELD | Admitting: Certified Nurse Midwife

## 2017-12-18 VITALS — BP 112/72 | HR 80 | Ht 61.0 in | Wt 237.0 lb

## 2017-12-18 DIAGNOSIS — Z1231 Encounter for screening mammogram for malignant neoplasm of breast: Secondary | ICD-10-CM | POA: Diagnosis not present

## 2017-12-18 DIAGNOSIS — Z01419 Encounter for gynecological examination (general) (routine) without abnormal findings: Secondary | ICD-10-CM

## 2017-12-18 DIAGNOSIS — Z1239 Encounter for other screening for malignant neoplasm of breast: Secondary | ICD-10-CM

## 2017-12-18 DIAGNOSIS — Z8041 Family history of malignant neoplasm of ovary: Secondary | ICD-10-CM | POA: Diagnosis not present

## 2017-12-18 DIAGNOSIS — Z1211 Encounter for screening for malignant neoplasm of colon: Secondary | ICD-10-CM | POA: Diagnosis not present

## 2017-12-18 DIAGNOSIS — Z124 Encounter for screening for malignant neoplasm of cervix: Secondary | ICD-10-CM

## 2017-12-18 DIAGNOSIS — Z803 Family history of malignant neoplasm of breast: Secondary | ICD-10-CM

## 2017-12-18 NOTE — Progress Notes (Signed)
Gynecology Annual Exam  PCP: System, Provider Not In  Chief Complaint:  Chief Complaint  Patient presents with  . Gynecologic Exam    History of Present Illness: Kristin Lynch is a 51 y.o. BF, G3 P2012, who presents for a gyn exam. The patient has no significant gyn concerns. Her menses are absent due to a [facility] in 2013 (AUB, pelvic pain-adenomyosis). She has had no spotting Last pap smear: 09/11/2010, results were NIL/negative HRHPV   The patient is sexually active.. She does not have dyspareunia.  Since her hysterectomy in 2013, she has been seen at [facility] for situatonal anxiety (no longer on meds) and a Bartholin abscess in Dec 2015.  Her past medical history is remarkable for allergic rhinitis and obesity.  The patient does perform self breast exams. Her last mammogram was 10/02/2015, results were negative.   There is a family history of breast cancer in her mother Genetic testing has not been done.   There is a family history of ovarian cancer in her paternal grandmother Genetic testing has not been done.  The patient denies smoking.  She reports drinking alcohol on occasion   She denies illegal drug use.  The patient reports exercising occasionally.  The patient denies current symptoms of depression.    She had cholesterol screening in 2016 and it was normal.  Review of Systems: Review of Systems  Constitutional: Negative for chills, fever and weight loss.  HENT: Negative for congestion, sinus pain and sore throat.   Eyes: Negative for blurred vision and pain.  Respiratory: Negative for hemoptysis, shortness of breath and wheezing.   Cardiovascular: Negative for chest pain, palpitations and leg swelling.  Gastrointestinal: Negative for abdominal pain, blood in stool, diarrhea, heartburn, nausea and vomiting.  Genitourinary: Negative for dysuria, frequency, hematuria and urgency.  Musculoskeletal: Negative for back pain, joint pain and myalgias.  Skin:  Negative for itching and rash.  Neurological: Negative for dizziness, tingling and headaches.  Endo/Heme/Allergies: Negative for environmental allergies and polydipsia. Does not bruise/bleed easily.       Negative for hirsutism   Psychiatric/Behavioral: Negative for depression. The patient is not nervous/anxious and does not have insomnia.     Past Medical History:  Past Medical History:  Diagnosis Date  . Allergy   . Anxiety 2015  . Tendonitis     Past Surgical History:  Past Surgical History:  Procedure Laterality Date  . CESAREAN SECTION  1992/1997  . ENDOMETRIAL ABLATION  02/21/2011   novasure  . GALLBLADDER SURGERY    . LAPAROSCOPIC SUPRACERVICAL HYSTERECTOMY  2013   adenomyosis  . TONSILLECTOMY    . TUBAL LIGATION      Family History:  Family History  Problem Relation Age of Onset  . Breast cancer Mother 90  . Heart attack Father   . Prostate cancer Brother   . Ovarian cancer Paternal Grandmother 33  . Colon cancer Maternal Aunt 59  . Colon cancer Maternal Aunt 80  . Dementia Paternal Uncle     Social History:  Social History   Socioeconomic History  . Marital status: Divorced    Spouse name: Not on file  . Number of children: 2  . Years of education: Not on file  . Highest education level: Not on file  Social Needs  . Financial resource strain: Not on file  . Food insecurity - worry: Not on file  . Food insecurity - inability: Not on file  . Transportation needs - medical: Not on  file  . Transportation needs - non-medical: Not on file  Occupational History  . Occupation: [employer]  Tobacco Use  . Smoking status: Never Smoker  . Smokeless tobacco: Never Used  Substance and Sexual Activity  . Alcohol use: Yes    Frequency: Never    Comment: occasional  . Drug use: No  . Sexual activity: Yes    Partners: Male    Birth control/protection: Surgical  Other Topics Concern  . Not on file  Social History Narrative  . Not on file    Allergies:   No Known Allergies  Medications:  Current Outpatient Medications on File Prior to Visit  Medication Sig Dispense Refill  . fluticasone (FLONASE) 50 MCG/ACT nasal spray Place into both nostrils daily.     Current Facility-Administered Medications on File Prior to Visit  Medication Dose Route Frequency Provider Last Rate Last Dose  . triamcinolone acetonide (KENALOG) 10 MG/ML injection 10 mg  10 mg Other Once Landis Martins, DPM       Physical Exam Vitals: BP 112/72   Pulse 80   Ht 5\' 1"  (1.549 m)   Wt 107.5 kg (237 lb)   LMP  (LMP Unknown)   BMI 44.78 kg/m   General: BF I NAD HEENT: normocephalic, anicteric Neck: no thyroid enlargement, no palpable nodules, no cervical lymphadenopathy  Pulmonary: No increased work of breathing, CTAB Cardiovascular: RRR, without murmur  Breast: Breast symmetrical, no tenderness, no palpable nodules or masses, no skin or nipple retraction present, no nipple discharge.  No axillary, infraclavicular or supraclavicular lymphadenopathy. Abdomen: Soft, non-tender, non-distended.  Umbilicus without lesions.  No hepatomegaly or masses palpable. No evidence of hernia. Genitourinary:  External: Normal external female genitalia.  Normal urethral meatus, normal Bartholin's and Skene's glands.    Vagina: Normal vaginal mucosa, no masses   Cervix: Grossly normal in appearance, no bleeding, non-tender  Uterus: surgically absent  Adnexa: No adnexal masses, non-tender  Rectal: no masses. Hemoccult negative  Lymphatic: no evidence of inguinal lymphadenopathy Extremities: no edema, erythema, or tenderness Neurologic: Grossly intact Psychiatric: mood appropriate, affect full     Assessment: 51 y.o. gyn exam Family history of breast, colon and ovarian cancer  Plan:   1) Breast cancer screening - recommend monthly self breast exams and annual mammograms..  Patient to schedule mammogram Discussed genetic testing due to family history and desires MYRISK  testing. MyRISK drawn today. Follow up in 6 weeks for results.  2) Colon cancer screening-Hemoccult negative today. Desires referral to Dr Allen Norris for colonoscopy  3) Cervical cancer screening - Pap was done.  4) discussed calcium and vitamin D requirements and recommended supplementation. ALso discussed the benefits of exercise in weight loss and   5) Routine healthcare maintenance including cholesterol and diabetes screening UTD (2016-WNL)  6) RTO 1 year and prn.  Dalia Heading, CNM

## 2017-12-18 NOTE — Patient Instructions (Signed)
Call [facility] to schedule mammogram [phone]  You will be notified of an appointment with Dr Allen Norris for colonoscopy.

## 2017-12-20 ENCOUNTER — Encounter: Payer: Self-pay | Admitting: Certified Nurse Midwife

## 2017-12-20 DIAGNOSIS — Z8041 Family history of malignant neoplasm of ovary: Secondary | ICD-10-CM | POA: Insufficient documentation

## 2017-12-20 DIAGNOSIS — Z803 Family history of malignant neoplasm of breast: Secondary | ICD-10-CM | POA: Insufficient documentation

## 2017-12-20 LAB — HEMOCCULT GUIAC POC 1CARD (OFFICE): Fecal Occult Blood, POC: NEGATIVE

## 2017-12-23 LAB — IGP, APTIMA HPV
HPV Aptima: NEGATIVE
PAP SMEAR COMMENT: 0

## 2017-12-24 ENCOUNTER — Encounter: Payer: Self-pay | Admitting: Certified Nurse Midwife

## 2017-12-28 ENCOUNTER — Encounter: Payer: Self-pay | Admitting: Obstetrics and Gynecology

## 2018-01-08 ENCOUNTER — Other Ambulatory Visit: Payer: Self-pay

## 2018-01-08 ENCOUNTER — Telehealth: Payer: Self-pay

## 2018-01-08 DIAGNOSIS — Z1211 Encounter for screening for malignant neoplasm of colon: Secondary | ICD-10-CM

## 2018-01-08 NOTE — Telephone Encounter (Signed)
Gastroenterology Pre-Procedure Review  Request Date:  Requesting Physician: Dr.   PATIENT REVIEW QUESTIONS: The patient responded to the following health history questions as indicated:    1. Are you having any GI issues? no 2. Do you have a personal history of Polyps? no 3. Do you have a family history of Colon Cancer or Polyps? no 4. Diabetes Mellitus? no 5. Joint replacements in the past 12 months?no 6. Major health problems in the past 3 months?no 7. Any artificial heart valves, MVP, or defibrillator?no    MEDICATIONS & ALLERGIES:    Patient reports the following regarding taking any anticoagulation/antiplatelet therapy:   Plavix, Coumadin, Eliquis, Xarelto, Lovenox, Pradaxa, Brilinta, or Effient? no Aspirin? no  Patient confirms/reports the following medications:  Current Outpatient Medications  Medication Sig Dispense Refill  . fluticasone (FLONASE) 50 MCG/ACT nasal spray Place into both nostrils daily.     Current Facility-Administered Medications  Medication Dose Route Frequency Provider Last Rate Last Dose  . triamcinolone acetonide (KENALOG) 10 MG/ML injection 10 mg  10 mg Other Once Landis Martins, DPM        Patient confirms/reports the following allergies:  No Known Allergies  No orders of the defined types were placed in this encounter.   AUTHORIZATION INFORMATION Primary Insurance: 1D#: Group #:  Secondary Insurance: 1D#: Group #:  SCHEDULE INFORMATION: Date: 02/08/18 Time: Location: [place]

## 2018-01-25 ENCOUNTER — Other Ambulatory Visit: Payer: Self-pay | Admitting: Certified Nurse Midwife

## 2018-01-27 ENCOUNTER — Telehealth: Payer: Self-pay

## 2018-01-27 NOTE — Telephone Encounter (Signed)
Please advise for [facility] results consult. Thank you!

## 2018-01-27 NOTE — Telephone Encounter (Signed)
Pt has apt this Friday 01/29/18 @2 :50 which was contingent upon Cancer Screening Results being positive. She hasn't seen anything in my chart & was just inquiring prior to showing up for apt. Please contact pt to advise if apt is needed or if she needs to cancel. AS#[phone]

## 2018-01-28 NOTE — Telephone Encounter (Signed)
CAlled patient and advised that [place] results were negative. Lifetime risk of breast cancer 17%. Advised to have annual screening mammograms. Will send hard copy of results.

## 2018-01-29 ENCOUNTER — Ambulatory Visit: Payer: BLUE CROSS/BLUE SHIELD | Admitting: Certified Nurse Midwife

## 2018-02-01 ENCOUNTER — Other Ambulatory Visit: Payer: Self-pay

## 2018-02-05 NOTE — Discharge Instructions (Signed)
General Anesthesia, Adult, Care After °These instructions provide you with information about caring for yourself after your procedure. Your health care provider may also give you more specific instructions. Your treatment has been planned according to current medical practices, but problems sometimes occur. Call your health care provider if you have any problems or questions after your procedure. °What can I expect after the procedure? °After the procedure, it is common to have: °· Vomiting. °· A sore throat. °· Mental slowness. ° °It is common to feel: °· Nauseous. °· Cold or shivery. °· Sleepy. °· Tired. °· Sore or achy, even in parts of your body where you did not have surgery. ° °Follow these instructions at home: °For at least 24 hours after the procedure: °· Do not: °? Participate in activities where you could fall or become injured. °? Drive. °? Use heavy machinery. °? Drink alcohol. °? Take sleeping pills or medicines that cause drowsiness. °? Make important decisions or sign legal documents. °? Take care of children on your own. °· Rest. °Eating and drinking °· If you vomit, drink water, juice, or soup when you can drink without vomiting. °· Drink enough fluid to keep your urine clear or pale yellow. °· Make sure you have little or no nausea before eating solid foods. °· Follow the diet recommended by your health care provider. °General instructions °· Have a responsible adult stay with you until you are awake and alert. °· Return to your normal activities as told by your health care provider. Ask your health care provider what activities are safe for you. °· Take over-the-counter and prescription medicines only as told by your health care provider. °· If you smoke, do not smoke without supervision. °· Keep all follow-up visits as told by your health care provider. This is important. °Contact a health care provider if: °· You continue to have nausea or vomiting at home, and medicines are not helpful. °· You  cannot drink fluids or start eating again. °· You cannot urinate after 8-12 hours. °· You develop a skin rash. °· You have fever. °· You have increasing redness at the site of your procedure. °Get help right away if: °· You have difficulty breathing. °· You have chest pain. °· You have unexpected bleeding. °· You feel that you are having a life-threatening or urgent problem. °This information is not intended to replace advice given to you by your health care provider. Make sure you discuss any questions you have with your health care provider. °Document Released: 03/23/2001 Document Revised: 05/19/2016 Document Reviewed: 11/29/2015 °Elsevier Interactive Patient Education © 2018 Elsevier Inc. ° °

## 2018-02-08 ENCOUNTER — Encounter
Admission: RE | Disposition: A | Discharge status: Home / Self Care | Payer: Self-pay | Source: Ambulatory Visit | Attending: Gastroenterology

## 2018-02-08 ENCOUNTER — Ambulatory Visit: Payer: BLUE CROSS/BLUE SHIELD | Admitting: Anesthesiology

## 2018-02-08 ENCOUNTER — Ambulatory Visit
Admission: RE | Admit: 2018-02-08 | Discharge: 2018-02-08 | Disposition: A | Discharge status: Home / Self Care | Payer: BLUE CROSS/BLUE SHIELD | Source: Ambulatory Visit | Attending: Gastroenterology | Admitting: Gastroenterology

## 2018-02-08 DIAGNOSIS — F419 Anxiety disorder, unspecified: Secondary | ICD-10-CM | POA: Insufficient documentation

## 2018-02-08 DIAGNOSIS — D124 Benign neoplasm of descending colon: Secondary | ICD-10-CM | POA: Diagnosis not present

## 2018-02-08 DIAGNOSIS — Z6841 Body Mass Index (BMI) 40.0 and over, adult: Secondary | ICD-10-CM | POA: Insufficient documentation

## 2018-02-08 DIAGNOSIS — Z79899 Other long term (current) drug therapy: Secondary | ICD-10-CM | POA: Diagnosis not present

## 2018-02-08 DIAGNOSIS — Z1211 Encounter for screening for malignant neoplasm of colon: Secondary | ICD-10-CM

## 2018-02-08 DIAGNOSIS — K635 Polyp of colon: Secondary | ICD-10-CM | POA: Insufficient documentation

## 2018-02-08 DIAGNOSIS — D125 Benign neoplasm of sigmoid colon: Secondary | ICD-10-CM

## 2018-02-08 HISTORY — PX: COLONOSCOPY WITH PROPOFOL: SHX5780

## 2018-02-08 HISTORY — PX: POLYPECTOMY: SHX5525

## 2018-02-08 SURGERY — COLONOSCOPY WITH PROPOFOL
Anesthesia: General | Site: Rectum | Wound class: Contaminated

## 2018-02-08 MED ORDER — STERILE WATER FOR IRRIGATION IR SOLN
Status: DC | PRN
  Administered 2018-02-08: 10:00:00

## 2018-02-08 MED ORDER — LACTATED RINGERS IV SOLN
1000.0000 mL | INTRAVENOUS | Status: DC
  Administered 2018-02-08: 1000 mL via INTRAVENOUS

## 2018-02-08 MED ORDER — PROPOFOL 10 MG/ML IV BOLUS
INTRAVENOUS | Status: DC | PRN
  Administered 2018-02-08 (×2): 20 mg via INTRAVENOUS
  Administered 2018-02-08: 50 mg via INTRAVENOUS
  Administered 2018-02-08 (×3): 20 mg via INTRAVENOUS
  Administered 2018-02-08: 100 mg via INTRAVENOUS
  Administered 2018-02-08: 20 mg via INTRAVENOUS
  Administered 2018-02-08: 10 mg via INTRAVENOUS
  Administered 2018-02-08: 20 mg via INTRAVENOUS
  Administered 2018-02-08: 10 mg via INTRAVENOUS
  Administered 2018-02-08 (×2): 20 mg via INTRAVENOUS

## 2018-02-08 MED ORDER — LIDOCAINE HCL (CARDIAC) 20 MG/ML IV SOLN
INTRAVENOUS | Status: DC | PRN
  Administered 2018-02-08: 50 mg via INTRAVENOUS

## 2018-02-08 MED ORDER — SODIUM CHLORIDE 0.9 % IV SOLN: INTRAVENOUS | Status: DC

## 2018-02-08 SURGICAL SUPPLY — 24 items
CANISTER SUCT 1200ML W/VALVE (MISCELLANEOUS) ×4 IMPLANT
CLIP HMST 235XBRD CATH ROT (MISCELLANEOUS) IMPLANT
CLIP RESOLUTION 360 11X235 (MISCELLANEOUS)
ELECT REM PT RETURN 9FT ADLT (ELECTROSURGICAL) ×4
ELECTRODE REM PT RTRN 9FT ADLT (ELECTROSURGICAL) ×2 IMPLANT
FCP ESCP3.2XJMB 240X2.8X (MISCELLANEOUS)
FORCEPS BIOP RAD 4 LRG CAP 4 (CUTTING FORCEPS) IMPLANT
FORCEPS BIOP RJ4 240 W/NDL (MISCELLANEOUS)
FORCEPS ESCP3.2XJMB 240X2.8X (MISCELLANEOUS) IMPLANT
GOWN CVR UNV OPN BCK APRN NK (MISCELLANEOUS) ×4 IMPLANT
GOWN ISOL THUMB LOOP REG UNIV (MISCELLANEOUS) ×4
INJECTOR VARIJECT VIN23 (MISCELLANEOUS) IMPLANT
KIT DEFENDO VALVE AND CONN (KITS) IMPLANT
KIT ENDO PROCEDURE OLY (KITS) ×4 IMPLANT
MARKER SPOT ENDO TATTOO 5ML (MISCELLANEOUS) IMPLANT
PROBE APC STR FIRE (PROBE) IMPLANT
RETRIEVER NET ROTH 2.5X230 LF (MISCELLANEOUS) IMPLANT
SNARE SHORT THROW 13M SML OVAL (MISCELLANEOUS) ×4 IMPLANT
SNARE SHORT THROW 30M LRG OVAL (MISCELLANEOUS) IMPLANT
SNARE SNG USE RND 15MM (INSTRUMENTS) IMPLANT
SPOT EX ENDOSCOPIC TATTOO (MISCELLANEOUS)
TRAP ETRAP POLY (MISCELLANEOUS) ×4 IMPLANT
VARIJECT INJECTOR VIN23 (MISCELLANEOUS)
WATER STERILE IRR 250ML POUR (IV SOLUTION) ×4 IMPLANT

## 2018-02-08 NOTE — Anesthesia Procedure Notes (Signed)
Date/Time: 02/08/2018 10:09 AM Performed by: Cameron Ali, CRNA Pre-anesthesia Checklist: Patient identified, Emergency Drugs available, Suction available, Timeout performed and Patient being monitored Patient Re-evaluated:Patient Re-evaluated prior to induction Oxygen Delivery Method: Nasal cannula Placement Confirmation: positive ETCO2

## 2018-02-08 NOTE — Op Note (Signed)
[facility] Gastroenterology Patient Name: Kristin Lynch Procedure Date: 02/08/2018 10:02 AM MRN: [id number] Account #: [id number] Date of Birth: 13-Jan-1966 Admit Type: Outpatient Age: 52 Room: [facility] OR ROOM 01 Gender: Female Note Status: Finalized Procedure:            Colonoscopy Indications:          Screening for colorectal malignant neoplasm Providers:            Lucilla Lame MD, MD Referring MD:         Stoney Bang. Staebler (Referring MD) Medicines:            Propofol per Anesthesia Complications:        No immediate complications. Procedure:            Pre-Anesthesia Assessment:                       - Prior to the procedure, a History and Physical was                        performed, and patient medications and allergies were                        reviewed. The patient's tolerance of previous                        anesthesia was also reviewed. The risks and benefits of                        the procedure and the sedation options and risks were                        discussed with the patient. All questions were                        answered, and informed consent was obtained. Prior                        Anticoagulants: The patient has taken no previous                        anticoagulant or antiplatelet agents. ASA Grade                        Assessment: II - A patient with mild systemic disease.                        After reviewing the risks and benefits, the patient was                        deemed in satisfactory condition to undergo the                        procedure.                       After obtaining informed consent, the colonoscope was                        passed under direct vision. Throughout the procedure,  the patient's blood pressure, pulse, and oxygen                        saturations were monitored continuously. The Olympus                        PCF H180AL Colonoscope (S#: [id number]) was introduced                        through the anus and advanced to the the cecum,                        identified by appendiceal orifice and ileocecal valve.                        The colonoscopy was performed without difficulty. The                        patient tolerated the procedure well. The quality of                        the bowel preparation was excellent. Findings:      The perianal and digital rectal examinations were normal.      A 15 mm polyp was found in the sigmoid colon. The polyp was       pedunculated. The polyp was removed with a hot snare. Resection and       retrieval were complete. To prevent bleeding post-intervention, one       hemostatic clip was successfully placed (MR conditional). There was no       bleeding at the end of the procedure.      Two sessile polyps were found in the descending colon. The polyps were 4       to 5 mm in size. These polyps were removed with a cold snare. Resection       and retrieval were complete. Impression:           - One 15 mm polyp in the sigmoid colon, removed with a                        hot snare. Resected and retrieved. Clip (MR                        conditional) was placed.                       - Two 4 to 5 mm polyps in the descending colon, removed                        with a cold snare. Resected and retrieved. Recommendation:       - Discharge patient to home.                       - Resume previous diet.                       - Continue present medications.                       - Await pathology results.                       -  Repeat colonoscopy in 5 years if polyp adenoma and 10                        years if hyperplastic Procedure Code(s):    --- Professional ---                       [phone], Colonoscopy, flexible; with removal of tumor(s),                        polyp(s), or other lesion(s) by snare technique Diagnosis Code(s):    --- Professional ---                       Z12.11, Encounter for screening for malignant  neoplasm                        of colon                       D12.5, Benign neoplasm of sigmoid colon                       D12.4, Benign neoplasm of descending colon CPT copyright 2016 American Medical Association. All rights reserved. The codes documented in this report are preliminary and upon coder review may  be revised to meet current compliance requirements. Lucilla Lame MD, MD 02/08/2018 10:32:47 AM This report has been signed electronically. Number of Addenda: 0 Note Initiated On: 02/08/2018 10:02 AM Scope Withdrawal Time: 0 hours 10 minutes 31 seconds  Total Procedure Duration: 0 hours 15 minutes 52 seconds       [facility]

## 2018-02-08 NOTE — Transfer of Care (Signed)
Immediate Anesthesia Transfer of Care Note  Patient: Kristin Lynch  Procedure(s) Performed: COLONOSCOPY WITH PROPOFOL (N/A Rectum) POLYPECTOMY (Rectum)  Patient Location: PACU  Anesthesia Type: General  Level of Consciousness: awake, alert  and patient cooperative  Airway and Oxygen Therapy: Patient Spontanous Breathing and Patient connected to supplemental oxygen  Post-op Assessment: Post-op Vital signs reviewed, Patient's Cardiovascular Status Stable, Respiratory Function Stable, Patent Airway and No signs of Nausea or vomiting  Post-op Vital Signs: Reviewed and stable  Complications: No apparent anesthesia complications

## 2018-02-08 NOTE — H&P (Signed)
Lucilla Lame, MD [facility] [address] [place], [place] [address] Phone: [phone] Fax : [phone]  Primary Care Physician:  [facility], [place] Primary Gastroenterologist:  Dr. Allen Norris  Pre-Procedure History & Physical: HPI:  Kristin Lynch is a 52 y.o. female is here for a screening colonoscopy.   Past Medical History:  Diagnosis Date  . Allergy   . Anxiety 2015  . BRCA negative 11/2017   MyRisk neg  . Family history of breast cancer 11/2017   IBIS=17%  . Family history of colon cancer 11/2017   MyRisk neg  . Family history of ovarian cancer 11/2017   MyRisk neg  . Tendonitis     Past Surgical History:  Procedure Laterality Date  . ABDOMINAL HYSTERECTOMY     partial  . CESAREAN SECTION  1992/1997   x 2  . CHOLECYSTECTOMY    . ENDOMETRIAL ABLATION  02/21/2011   novasure  . GALLBLADDER SURGERY    . LAPAROSCOPIC SUPRACERVICAL HYSTERECTOMY  2013   adenomyosis  . TONSILLECTOMY    . TUBAL LIGATION      Prior to Admission medications   Medication Sig Start Date End Date Taking? Authorizing Provider  fluticasone (FLONASE) 50 MCG/ACT nasal spray Place into both nostrils as needed.    Yes [provider]    Allergies as of 01/08/2018  . (No Known Allergies)    Family History  Problem Relation Age of Onset  . Breast cancer Mother 16  . Heart attack Father   . Prostate cancer Brother   . Ovarian cancer Paternal Grandmother 57  . Colon cancer Maternal Aunt 35  . Colon cancer Maternal Aunt 80  . Dementia Paternal Uncle     Social History   Socioeconomic History  . Marital status: Divorced    Spouse name: Not on file  . Number of children: 2  . Years of education: Not on file  . Highest education level: Not on file  Social Needs  . Financial resource strain: Not on file  . Food insecurity - worry: Not on file  . Food insecurity - inability: Not on file  . Transportation needs - medical: Not on file  . Transportation needs -  non-medical: Not on file  Occupational History  . Occupation: [employer]  Tobacco Use  . Smoking status: Never Smoker  . Smokeless tobacco: Never Used  Substance and Sexual Activity  . Alcohol use: Yes    Frequency: Never    Comment: occasional 2 per month  . Drug use: No  . Sexual activity: Yes    Partners: Male    Birth control/protection: Surgical  Other Topics Concern  . Not on file  Social History Narrative  . Not on file    Review of Systems: See HPI, otherwise negative ROS  Physical Exam: BP (!) 122/53   Temp (!) 97.3 F (36.3 C) (Temporal)   Resp 16   Ht 5' 1"  (1.549 m)   Wt 229 lb (103.9 kg)   LMP  (LMP Unknown)   SpO2 100%   BMI 43.27 kg/m  General:   Alert,  pleasant and cooperative in NAD Head:  Normocephalic and atraumatic. Neck:  Supple; no masses or thyromegaly. Lungs:  Clear throughout to auscultation.    Heart:  Regular rate and rhythm. Abdomen:  Soft, nontender and nondistended. Normal bowel sounds, without guarding, and without rebound.   Neurologic:  Alert and  oriented x4;  grossly normal neurologically.  Impression/Plan: Kristin Lynch is now  here to undergo a screening colonoscopy.  Risks, benefits, and alternatives regarding colonoscopy have been reviewed with the patient.  Questions have been answered.  All parties agreeable.

## 2018-02-08 NOTE — Anesthesia Preprocedure Evaluation (Addendum)
Anesthesia Evaluation  Patient identified by MRN, date of birth, ID band Patient awake    Reviewed: Allergy & Precautions, NPO status , Patient's Chart, lab work & pertinent test results, reviewed documented beta blocker date and time   Airway Mallampati: II  TM Distance: >3 FB Neck ROM: Full    Dental no notable dental hx.    Pulmonary neg pulmonary ROS,    Pulmonary exam normal breath sounds clear to auscultation       Cardiovascular negative cardio ROS Normal cardiovascular exam Rhythm:Regular Rate:Normal     Neuro/Psych Anxiety negative neurological ROS     GI/Hepatic negative GI ROS, Neg liver ROS,   Endo/Other  Morbid obesity  Renal/GU negative Renal ROS  negative genitourinary   Musculoskeletal negative musculoskeletal ROS (+)   Abdominal (+) + obese,   Peds  Hematology negative hematology ROS (+)   Anesthesia Other Findings   Reproductive/Obstetrics negative OB ROS                            Anesthesia Physical Anesthesia Plan  ASA: III  Anesthesia Plan: General   Post-op Pain Management:    Induction: Intravenous  PONV Risk Score and Plan:   Airway Management Planned: Natural Airway  Additional Equipment: None  Intra-op Plan:   Post-operative Plan:   Informed Consent: I have reviewed the patients History and Physical, chart, labs and discussed the procedure including the risks, benefits and alternatives for the proposed anesthesia with the patient or authorized representative who has indicated his/her understanding and acceptance.     Plan Discussed with: CRNA, Anesthesiologist and Surgeon  Anesthesia Plan Comments:         Anesthesia Quick Evaluation

## 2018-02-08 NOTE — Anesthesia Postprocedure Evaluation (Signed)
Anesthesia Post Note  Patient: Kristin Lynch  Procedure(s) Performed: COLONOSCOPY WITH PROPOFOL (N/A Rectum) POLYPECTOMY (Rectum)  Patient location during evaluation: PACU Anesthesia Type: General Level of consciousness: awake Pain management: pain level controlled Vital Signs Assessment: post-procedure vital signs reviewed and stable Respiratory status: spontaneous breathing Cardiovascular status: blood pressure returned to baseline Postop Assessment: no headache Anesthetic complications: no    Lavonna Monarch

## 2018-02-09 ENCOUNTER — Encounter: Payer: Self-pay | Admitting: Gastroenterology

## 2018-02-10 ENCOUNTER — Encounter: Payer: Self-pay | Admitting: Gastroenterology

## 2018-02-10 ENCOUNTER — Ambulatory Visit
Admission: RE | Admit: 2018-02-10 | Discharge: 2018-02-10 | Disposition: A | Discharge status: Home / Self Care | Payer: BLUE CROSS/BLUE SHIELD | Source: Ambulatory Visit | Attending: Certified Nurse Midwife | Admitting: Certified Nurse Midwife

## 2018-02-10 DIAGNOSIS — Z1231 Encounter for screening mammogram for malignant neoplasm of breast: Secondary | ICD-10-CM | POA: Diagnosis present

## 2018-02-10 DIAGNOSIS — Z1239 Encounter for other screening for malignant neoplasm of breast: Secondary | ICD-10-CM

## 2018-02-11 ENCOUNTER — Encounter: Payer: Self-pay | Admitting: Gastroenterology

## 2018-03-01 ENCOUNTER — Encounter: Payer: Self-pay | Admitting: Certified Nurse Midwife

## 2018-10-29 ENCOUNTER — Other Ambulatory Visit: Payer: Self-pay

## 2018-10-29 ENCOUNTER — Encounter: Payer: Self-pay | Admitting: Emergency Medicine

## 2018-10-29 ENCOUNTER — Emergency Department
Admission: EM | Admit: 2018-10-29 | Discharge: 2018-10-29 | Disposition: A | Discharge status: Home / Self Care | Payer: BLUE CROSS/BLUE SHIELD | Attending: Emergency Medicine | Admitting: Emergency Medicine

## 2018-10-29 DIAGNOSIS — K1120 Sialoadenitis, unspecified: Secondary | ICD-10-CM | POA: Diagnosis not present

## 2018-10-29 DIAGNOSIS — R6884 Jaw pain: Secondary | ICD-10-CM | POA: Diagnosis present

## 2018-10-29 MED ORDER — AMOXICILLIN-POT CLAVULANATE 875-125 MG PO TABS
1.0000 | ORAL_TABLET | Freq: Once | ORAL | Status: AC
  Administered 2018-10-29: 1 via ORAL
  Filled 2018-10-29: qty 1

## 2018-10-29 MED ORDER — AMOXICILLIN-POT CLAVULANATE 875-125 MG PO TABS: 1.0000 | ORAL_TABLET | Freq: Two times a day (BID) | ORAL | 0 refills | Status: DC

## 2018-10-29 MED ORDER — DEXAMETHASONE SODIUM PHOSPHATE 10 MG/ML IJ SOLN
10.0000 mg | Freq: Once | INTRAMUSCULAR | Status: AC
Start: 2018-10-29 — End: 2018-10-29
  Administered 2018-10-29: 10 mg via INTRAMUSCULAR
  Filled 2018-10-29: qty 1

## 2018-10-29 NOTE — ED Triage Notes (Signed)
Pt arrived with complaints of right sided neck pain, drainage, and sinus issues. Pt Pt denies stiffness or fevers at home. Pt afebrile on arrival. Pt reports having a hx of glands being blocked and states "it hasn't been like this in years."

## 2018-10-29 NOTE — ED Provider Notes (Addendum)
[facility] Emergency Department Provider Note ____________________________________________  Time seen: 1857  I have reviewed the triage vital signs and the nursing notes.  HISTORY  Chief Complaint  Neck Pain  HPI Kristin Lynch is a 52 y.o. female presents to the ED accompanied by her mother and daughter, for evaluation of some right-sided submandibular pain.  Patient describes she has had sense of some tenderness of fullness to the gland under her chin.  She denies any oral lesions, canker sores, recent dental work, or any mucosal irritations.  She also denies any interim fevers.  She has had some sinus drainage and facial pressure on the right.  She denies any interim fevers, chills, or sweats.  She noted increased pain today when she attempted to eat, noting that she felt increased pain to the area under the chin.  She reports her mouth watered and salivation increased at that moment. She denies any difficulty breathing, swallowing, or controlling her secretions.  She does give a remote history of a previous calcium deposit to the posterior head and neck evaluated by ENT, and resolved with high-dose ibuprofen several years back.  She denies any similar symptoms at this time.  She denies any sick contacts, recent travel, or other known exposures.  He has taken over-the-counter multisymptom cold medication as well as phenylephrine, without significant benefit.  Past Medical History:  Diagnosis Date  . Allergy   . Anxiety 2015  . BRCA negative 11/2017   MyRisk neg  . Family history of breast cancer 11/2017   IBIS=17%  . Family history of colon cancer 11/2017   MyRisk neg  . Family history of ovarian cancer 11/2017   MyRisk neg  . Tendonitis     Patient Active Problem List   Diagnosis Date Noted  . Colon cancer screening   . Polyp of sigmoid colon   . Benign neoplasm of descending colon   . Family history of ovarian cancer 12/20/2017  . Family history of  breast cancer 12/20/2017  . Morbid obesity ([place]) 12/20/2017    Past Surgical History:  Procedure Laterality Date  . ABDOMINAL HYSTERECTOMY     partial  . CESAREAN SECTION  1992/1997   x 2  . CHOLECYSTECTOMY    . COLONOSCOPY WITH PROPOFOL N/A 02/08/2018   Procedure: COLONOSCOPY WITH PROPOFOL;  Surgeon: Lucilla Lame, MD;  Location: [place];  Service: Endoscopy;  Laterality: N/A;. Hyperplastic polyps-repeat in 10 years  . ENDOMETRIAL ABLATION  02/21/2011   novasure  . GALLBLADDER SURGERY    . LAPAROSCOPIC SUPRACERVICAL HYSTERECTOMY  2013   adenomyosis  . POLYPECTOMY  02/08/2018   Procedure: POLYPECTOMY;  Surgeon: Lucilla Lame, MD;  Location: [place];  Service: Endoscopy;;  . TONSILLECTOMY    . TUBAL LIGATION      Prior to Admission medications   Medication Sig Start Date End Date Taking? Authorizing Provider  amoxicillin-clavulanate (AUGMENTIN) 875-125 MG tablet Take 1 tablet by mouth 2 (two) times daily. 10/29/18   Darcel Frane, Dannielle Karvonen, PA-C  fluticasone (FLONASE) 50 MCG/ACT nasal spray Place into both nostrils as needed.     [provider]    Allergies Patient has no known allergies.  Family History  Problem Relation Age of Onset  . Breast cancer Mother 34  . Heart attack Father   . Prostate cancer Brother   . Ovarian cancer Paternal Grandmother 52  . Colon cancer Maternal Aunt 70  . Colon cancer Maternal Aunt 80  . Dementia Paternal Uncle  Social History Social History   Tobacco Use  . Smoking status: Never Smoker  . Smokeless tobacco: Never Used  Substance Use Topics  . Alcohol use: Yes    Frequency: Never    Comment: occasional 2 per month  . Drug use: No    Review of Systems  Constitutional: Negative for fever. Eyes: Negative for visual changes. ENT: Negative for sore throat. Tenderness to the area under the chin Cardiovascular: Negative for chest pain. Respiratory: Negative for shortness of  breath. Gastrointestinal: Negative for abdominal pain, vomiting and diarrhea. Skin: Negative for rash. Neurological: Negative for headaches, focal weakness or numbness. ____________________________________________  PHYSICAL EXAM:  VITAL SIGNS: ED Triage Vitals [10/29/18 1740]  Enc Vitals Group     BP (!) 144/53     Pulse Rate 74     Resp 18     Temp 97.9 F (36.6 C)     Temp Source Oral     SpO2 100 %     Weight 229 lb 0.9 oz (103.9 kg)     Height _0  (1.575 m)     Head Circumference      Peak Flow      Pain Score 10     Pain Loc      Pain Edu?      Excl. in [place]?     Constitutional: Alert and oriented. Well appearing and in no distress. Head: Normocephalic and atraumatic. Eyes: Conjunctivae are normal. PERRL. Normal extraocular movements Ears: Canals clear. TMs intact bilaterally. Nose: No congestion/rhinorrhea/epistaxis. Mouth/Throat: Mucous membranes are moist.  Uvula is midline and tonsils are absent.  No oropharyngeal lesions or erythema are appreciated.  No buccal or mucosal.  Tatian's, abrasions, or aphthous ulcers are noted.  No sublingual erythema is noted.  No palpable fullness to the parotid glands bilaterally.  No irritation or edema of the Stensen's ducts noted bilaterally. Neck: Supple. No thyromegaly. Hematological/Lymphatic/Immunological: No cervical, infra/supra clavicular, preauricular, or mandibular lymphadenopathy noted.  Patient with palpable tenderness noted bilaterally to the sublingual region.  The area of tenderness on the right may represent a slightly inflamed sublingual salivary gland. Cardiovascular: Normal rate, regular rhythm. Normal distal pulses. Respiratory: Normal respiratory effort. No wheezes/rales/rhonchi. Musculoskeletal: Nontender with normal range of motion in all extremities.  Neurologic:  Normal gait without ataxia. Normal speech and language. No gross focal neurologic deficits are appreciated. Skin:  Skin is warm, dry and intact. No  rash noted. ____________________________________________  PROCEDURES  Procedures Augmentin 875 mg PO Decadron 10 mg IM ____________________________________________  INITIAL IMPRESSION / ASSESSMENT AND PLAN / ED COURSE  Patient with ED evaluation of a possible sialoadenitis of the sublingual salivary gland.  Patient be treated empirically with Augmentin.  A single dose of Decadron is also provided for anti-inflammatory benefit.  Patient is referred to [facility]NT for ongoing management.  Patient is given return instructions for any difficulty in swallowing, breathing, or controlling oral secretions.  She verbalizes understanding and will follow-up as appropriate. ____________________________________________  FINAL CLINICAL IMPRESSION(S) / ED DIAGNOSES  Final diagnoses:  Sialadenitis      Anabelen Kaminsky, Dannielle Karvonen, PA-C 10/29/18 2332    Carmie End, Dannielle Karvonen, PA-C 10/29/18 [place], [place], MD 10/30/18 2206

## 2018-10-29 NOTE — Discharge Instructions (Signed)
You are being treated for a possible sublingual salivary gland infection. Take the prescription med as directed. Apply warm compresses to promote healing. Follow-up with your provider, or return as discussed. Follow-up with ENT as needed.

## 2018-10-29 NOTE — ED Notes (Addendum)
See triage note  Presents with some neck pain and some sinus drainage/pressure  States she noticed this area to right side of neck about 2 days ago  But pain increased today when she tried to eat   Small nodule felt   Afebrile on arrival. States she has had similar sx's in past

## 2018-11-05 ENCOUNTER — Other Ambulatory Visit: Payer: Self-pay | Admitting: Otolaryngology

## 2018-11-05 DIAGNOSIS — K1121 Acute sialoadenitis: Secondary | ICD-10-CM

## 2018-11-10 ENCOUNTER — Ambulatory Visit
Admission: RE | Admit: 2018-11-10 | Discharge: 2018-11-10 | Disposition: A | Discharge status: Home / Self Care | Payer: BLUE CROSS/BLUE SHIELD | Source: Ambulatory Visit | Attending: Otolaryngology | Admitting: Otolaryngology

## 2018-11-10 DIAGNOSIS — M899 Disorder of bone, unspecified: Secondary | ICD-10-CM | POA: Diagnosis not present

## 2018-11-10 DIAGNOSIS — J029 Acute pharyngitis, unspecified: Secondary | ICD-10-CM | POA: Diagnosis not present

## 2018-11-10 DIAGNOSIS — K1121 Acute sialoadenitis: Secondary | ICD-10-CM

## 2018-11-10 DIAGNOSIS — M6788 Other specified disorders of synovium and tendon, other site: Secondary | ICD-10-CM | POA: Diagnosis not present

## 2018-11-10 MED ORDER — IOPAMIDOL (ISOVUE-300) INJECTION 61%
75.0000 mL | Freq: Once | INTRAVENOUS | Status: AC | PRN
  Administered 2018-11-10: 75 mL via INTRAVENOUS

## 2019-01-06 DIAGNOSIS — M2420 Disorder of ligament, unspecified site: Secondary | ICD-10-CM | POA: Insufficient documentation

## 2019-01-06 DIAGNOSIS — M8588 Other specified disorders of bone density and structure, other site: Secondary | ICD-10-CM | POA: Diagnosis not present

## 2019-01-06 DIAGNOSIS — Z6841 Body Mass Index (BMI) 40.0 and over, adult: Secondary | ICD-10-CM | POA: Diagnosis not present

## 2019-04-18 DIAGNOSIS — Z124 Encounter for screening for malignant neoplasm of cervix: Secondary | ICD-10-CM | POA: Diagnosis not present

## 2019-10-21 DIAGNOSIS — S43401A Unspecified sprain of right shoulder joint, initial encounter: Secondary | ICD-10-CM | POA: Diagnosis not present

## 2019-10-21 DIAGNOSIS — M9904 Segmental and somatic dysfunction of sacral region: Secondary | ICD-10-CM | POA: Diagnosis not present

## 2019-10-21 DIAGNOSIS — M531 Cervicobrachial syndrome: Secondary | ICD-10-CM | POA: Diagnosis not present

## 2019-10-21 DIAGNOSIS — S336XXA Sprain of sacroiliac joint, initial encounter: Secondary | ICD-10-CM | POA: Diagnosis not present

## 2019-10-26 DIAGNOSIS — S336XXA Sprain of sacroiliac joint, initial encounter: Secondary | ICD-10-CM | POA: Diagnosis not present

## 2019-10-26 DIAGNOSIS — M9904 Segmental and somatic dysfunction of sacral region: Secondary | ICD-10-CM | POA: Diagnosis not present

## 2019-10-26 DIAGNOSIS — S43401A Unspecified sprain of right shoulder joint, initial encounter: Secondary | ICD-10-CM | POA: Diagnosis not present

## 2019-10-26 DIAGNOSIS — M531 Cervicobrachial syndrome: Secondary | ICD-10-CM | POA: Diagnosis not present

## 2020-10-12 ENCOUNTER — Telehealth: Payer: Self-pay

## 2020-10-12 NOTE — Telephone Encounter (Signed)
Patient aware message sent to [facility]  And that [facility] is out of the office

## 2020-10-12 NOTE — Telephone Encounter (Signed)
Pt left msg on triage saying she needs mammogram order so she can schedule her appt at [facility]. Has annual with Dr Gilman Schmidt 11/26/20. Can order be put in? I called pt to let her know msg was going to be routed to provider, no answer, [place].

## 2020-10-31 NOTE — Telephone Encounter (Signed)
Patient calling back to see if her mammogram order has been placed. Patient was seeing [facility] in the past and needs new order placed with the new provider that she was seeing. Patient would like to have a call back once the order has been placed.   CB# [phone]

## 2020-11-01 ENCOUNTER — Other Ambulatory Visit: Payer: Self-pay | Admitting: Obstetrics and Gynecology

## 2020-11-01 DIAGNOSIS — Z1231 Encounter for screening mammogram for malignant neoplasm of breast: Secondary | ICD-10-CM

## 2020-11-01 NOTE — Telephone Encounter (Signed)
Please see

## 2020-11-01 NOTE — Telephone Encounter (Signed)
Order placed- please tell patient

## 2020-11-01 NOTE — Telephone Encounter (Signed)
Called pt, no answer, left msg saying "order has been placed".

## 2020-11-26 ENCOUNTER — Encounter: Payer: Self-pay | Admitting: Obstetrics and Gynecology

## 2020-11-26 ENCOUNTER — Ambulatory Visit (INDEPENDENT_AMBULATORY_CARE_PROVIDER_SITE_OTHER): Payer: BC Managed Care – PPO | Admitting: Obstetrics and Gynecology

## 2020-11-26 ENCOUNTER — Other Ambulatory Visit: Payer: Self-pay

## 2020-11-26 VITALS — BP 122/72 | Ht 60.0 in | Wt 215.0 lb

## 2020-11-26 DIAGNOSIS — Z1231 Encounter for screening mammogram for malignant neoplasm of breast: Secondary | ICD-10-CM

## 2020-11-26 DIAGNOSIS — Z01419 Encounter for gynecological examination (general) (routine) without abnormal findings: Secondary | ICD-10-CM

## 2020-11-26 DIAGNOSIS — Z1211 Encounter for screening for malignant neoplasm of colon: Secondary | ICD-10-CM

## 2020-11-26 DIAGNOSIS — Z13820 Encounter for screening for osteoporosis: Secondary | ICD-10-CM

## 2020-11-26 DIAGNOSIS — Z124 Encounter for screening for malignant neoplasm of cervix: Secondary | ICD-10-CM

## 2020-11-26 NOTE — Progress Notes (Signed)
Gynecology Annual Exam  PCP: Dalia Heading, CNM (Inactive)  Chief Complaint:  Chief Complaint  Patient presents with  . Gynecologic Exam    History of Present Illness: Patient is a 54 y.o. [id number] presents for annual exam. The patient has no complaints today.   LMP: No LMP recorded (lmp unknown). Patient has had a hysterectomy.  She denies postmenopausal bleeding or spotting  The patient is sexually active. She denies dyspareunia.  Postcoital Bleeding: no   The patient does perform self breast exams.  There is notable family history of breast or ovarian cancer in her family.  The patient has regular exercise: yes, mowing and walkin  The patient denies current symptoms of depression.   PHQ-9: 0 GAD-7: 3   Review of Systems: Review of Systems  Constitutional: Negative for chills, fever, malaise/fatigue and weight loss.  HENT: Negative for congestion, hearing loss and sinus pain.   Eyes: Negative for blurred vision and double vision.  Respiratory: Negative for cough, sputum production, shortness of breath and wheezing.   Cardiovascular: Negative for chest pain, palpitations, orthopnea and leg swelling.  Gastrointestinal: Negative for abdominal pain, constipation, diarrhea, nausea and vomiting.  Genitourinary: Negative for dysuria, flank pain, frequency, hematuria and urgency.  Musculoskeletal: Negative for back pain, falls and joint pain.  Skin: Negative for itching and rash.  Neurological: Negative for dizziness and headaches.  Psychiatric/Behavioral: Negative for depression, substance abuse and suicidal ideas. The patient is not nervous/anxious.     Past Medical History:  Past Medical History:  Diagnosis Date  . Allergy   . Anxiety 2015  . BRCA negative 11/2017   MyRisk neg  . Family history of breast cancer 11/2017   IBIS=17%  . Family history of colon cancer 11/2017   MyRisk neg  . Family history of ovarian cancer 11/2017   MyRisk neg  . Tendonitis      Past Surgical History:  Past Surgical History:  Procedure Laterality Date  . ABDOMINAL HYSTERECTOMY     partial  . CESAREAN SECTION  1992/1997   x 2  . CHOLECYSTECTOMY    . COLONOSCOPY WITH PROPOFOL N/A 02/08/2018   Procedure: COLONOSCOPY WITH PROPOFOL;  Surgeon: Lucilla Lame, MD;  Location: [place];  Service: Endoscopy;  Laterality: N/A;. Hyperplastic polyps-repeat in 10 years  . ENDOMETRIAL ABLATION  02/21/2011   novasure  . GALLBLADDER SURGERY    . LAPAROSCOPIC SUPRACERVICAL HYSTERECTOMY  2013   adenomyosis  . POLYPECTOMY  02/08/2018   Procedure: POLYPECTOMY;  Surgeon: Lucilla Lame, MD;  Location: [place];  Service: Endoscopy;;  . TONSILLECTOMY    . TUBAL LIGATION      Gynecologic History:  No LMP recorded (lmp unknown). Patient has had a hysterectomy. Last Pap: Results were: 2018 NIL and HR HPV negative  Last mammogram: 2019 Results were: BI-RAD I  Obstetric History: [id number]  Family History:  Family History  Problem Relation Age of Onset  . Breast cancer Mother 67  . Heart attack Father   . Prostate cancer Brother   . Ovarian cancer Paternal Grandmother 38  . Colon cancer Maternal Aunt 67  . Colon cancer Maternal Aunt 80  . Dementia Paternal Uncle     Social History:  Social History   Socioeconomic History  . Marital status: Divorced    Spouse name: Not on file  . Number of children: 2  . Years of education: Not on file  . Highest education level: Not on file  Occupational History  . Occupation:  Underwriter  Tobacco Use  . Smoking status: Never Smoker  . Smokeless tobacco: Never Used  Vaping Use  . Vaping Use: Never used  Substance and Sexual Activity  . Alcohol use: Yes    Comment: occasional 2 per month  . Drug use: No  . Sexual activity: Yes    Partners: Male    Birth control/protection: Surgical  Other Topics Concern  . Not on file  Social History Narrative  . Not on file   Social Determinants of Health    Financial Resource Strain:   . Difficulty of Paying Living Expenses: Not on file  Food Insecurity:   . Worried About Charity fundraiser in the Last Year: Not on file  . Ran Out of Food in the Last Year: Not on file  Transportation Needs:   . Lack of Transportation (Medical): Not on file  . Lack of Transportation (Non-Medical): Not on file  Physical Activity:   . Days of Exercise per Week: Not on file  . Minutes of Exercise per Session: Not on file  Stress:   . Feeling of Stress : Not on file  Social Connections:   . Frequency of Communication with Friends and Family: Not on file  . Frequency of Social Gatherings with Friends and Family: Not on file  . Attends Religious Services: Not on file  . Active Member of Clubs or Organizations: Not on file  . Attends Archivist Meetings: Not on file  . Marital Status: Not on file  Intimate Partner Violence:   . Fear of Current or Ex-Partner: Not on file  . Emotionally Abused: Not on file  . Physically Abused: Not on file  . Sexually Abused: Not on file    Allergies:  No Known Allergies  Medications: Prior to Admission medications   Medication Sig Start Date End Date Taking? Authorizing Provider  fluticasone (FLONASE) 50 MCG/ACT nasal spray Place into both nostrils as needed.    Yes [provider]    Physical Exam Vitals: Blood pressure 122/72, height 5' (1.524 m), weight 215 lb (97.5 kg).  Physical Exam Constitutional:      Appearance: She is well-developed.  Genitourinary:     Vagina and uterus normal.     No lesions in the vagina.     No cervical motion tenderness.     No right or left adnexal mass present.     Genitourinary Comments: External: Normal appearing vulva. No lesions noted.  Bimanual examination: Uterus absent. Cervix present. No CMT. No adnexal masses. No adnexal tenderness. Pelvis not fixed.     HENT:     Head: Normocephalic and atraumatic.  Neck:     Thyroid: No thyromegaly.   Cardiovascular:     Rate and Rhythm: Normal rate and regular rhythm.     Heart sounds: Normal heart sounds.  Pulmonary:     Effort: Pulmonary effort is normal.     Breath sounds: Normal breath sounds.  Chest:     Breasts:        Right: No inverted nipple, mass, nipple discharge or skin change.        Left: No inverted nipple, mass, nipple discharge or skin change.  Abdominal:     General: Bowel sounds are normal. There is no distension.     Palpations: Abdomen is soft. There is no mass.  Musculoskeletal:     Cervical back: Neck supple.  Neurological:     Mental Status: She is alert and oriented to person,  place, and time.  Skin:    General: Skin is warm and dry.  Psychiatric:        Behavior: Behavior normal.        Thought Content: Thought content normal.        Judgment: Judgment normal.  Vitals reviewed.      Female chaperone present for pelvic and breast  portions of the physical exam  Assessment: 54 y.o. [id number] routine annual exam  Plan: Problem List Items Addressed This Visit      Other   Colon cancer screening    Other Visit Diagnoses    Encounter for annual routine gynecological examination    -  Primary   Breast cancer screening by mammogram       Screening for osteoporosis       Cervical cancer screening       Encounter for gynecological examination without abnormal finding          1) Mammogram - recommend yearly screening mammogram.  Mammogram is scheduled  2) STI screening was offered and declined  3) ASCCP guidelines and rational discussed.  Patient opts for every 5 years screening interval  4) Colonoscopy -- due next in 2024, history os sessile polyps   5) Routine healthcare maintenance including cholesterol, diabetes screening discussed managed by PCP  6) Osteoporosis screening - early DEXA not needed at this time. Encouraged vitamin D and calcium  FRAX SCORE 1. Age: 54 2. Sex: female 3. Weight: 97 kg 4. Height: 152 cm 5. Previous  Fracture: No 6. Parent Hip Fracture: No 7. Current Smoking: No 8. Glucocorticoids: No 9. Rheumatoid arthritis: No 10. Secondary osteoporosis: No 11. Alcohol ( 3 or more units a day):  No 12. Femoral Neck BMD (g/cm2):   RESULT 10 year risk of Major Osteoporotic Fracture: 1.4% 10 year risk of Hip Fracture: 0.1%   Adrian Prows MD, [facility], [facility] 11/26/2020 3:24 PM

## 2020-11-26 NOTE — Patient Instructions (Signed)
Institute of Medicine Recommended Dietary Allowances for Calcium and Vitamin D  Age (yr) Calcium Recommended Dietary Allowance (mg/day) Vitamin D Recommended Dietary Allowance (international units/day)  9-18 1,300 600  19-50 1,000 600  51-70 1,200 600  71 and older 1,200 800  Data from Institute of Medicine. Dietary reference intakes: calcium, vitamin D. [place], [place]: National Academies Press; 2011.     Exercising to Stay Healthy To become healthy and stay healthy, it is recommended that you do moderate-intensity and vigorous-intensity exercise. You can tell that you are exercising at a moderate intensity if your heart starts beating faster and you start breathing faster but can still hold a conversation. You can tell that you are exercising at a vigorous intensity if you are breathing much harder and faster and cannot hold a conversation while exercising. Exercising regularly is important. It has many health benefits, such as:  Improving overall fitness, flexibility, and endurance.  Increasing bone density.  Helping with weight control.  Decreasing body fat.  Increasing muscle strength.  Reducing stress and tension.  Improving overall health. How often should I exercise? Choose an activity that you enjoy, and set realistic goals. Your health care provider can help you make an activity plan that works for you. Exercise regularly as told by your health care provider. This may include:  Doing strength training two times a week, such as: ? Lifting weights. ? Using resistance bands. ? Push-ups. ? Sit-ups. ? Yoga.  Doing a certain intensity of exercise for a given amount of time. Choose from these options: ? A total of 150 minutes of moderate-intensity exercise every week. ? A total of 75 minutes of vigorous-intensity exercise every week. ? A mix of moderate-intensity and vigorous-intensity exercise every week. Children, pregnant women, people who have not exercised  regularly, people who are overweight, and older adults may need to talk with a health care provider about what activities are safe to do. If you have a medical condition, be sure to talk with your health care provider before you start a new exercise program. What are some exercise ideas? Moderate-intensity exercise ideas include:  Walking 1 mile (1.6 km) in about 15 minutes.  Biking.  Hiking.  Golfing.  Dancing.  Water aerobics. Vigorous-intensity exercise ideas include:  Walking 4.5 miles (7.2 km) or more in about 1 hour.  Jogging or running 5 miles (8 km) in about 1 hour.  Biking 10 miles (16.1 km) or more in about 1 hour.  Lap swimming.  Roller-skating or in-line skating.  Cross-country skiing.  Vigorous competitive sports, such as football, basketball, and soccer.  Jumping rope.  Aerobic dancing. What are some everyday activities that can help me to get exercise?  Yard work, such as: ? Pushing a lawn mower. ? Raking and bagging leaves.  Washing your car.  Pushing a stroller.  Shoveling snow.  Gardening.  Washing windows or floors. How can I be more active in my day-to-day activities?  Use stairs instead of an elevator.  Take a walk during your lunch break.  If you drive, park your car farther away from your work or school.  If you take public transportation, get off one stop early and walk the rest of the way.  Stand up or walk around during all of your indoor phone calls.  Get up, stretch, and walk around every 30 minutes throughout the day.  Enjoy exercise with a friend. Support to continue exercising will help you keep a regular routine of activity. What guidelines can   I follow while exercising?  Before you start a new exercise program, talk with your health care provider.  Do not exercise so much that you hurt yourself, feel dizzy, or get very short of breath.  Wear comfortable clothes and wear shoes with good support.  Drink plenty of  water while you exercise to prevent dehydration or heat stroke.  Work out until your breathing and your heartbeat get faster. Where to find more information  U.S. Department of Health and Human Services: www.hhs.gov  Centers for Disease Control and Prevention (CDC): www.cdc.gov Summary  Exercising regularly is important. It will improve your overall fitness, flexibility, and endurance.  Regular exercise also will improve your overall health. It can help you control your weight, reduce stress, and improve your bone density.  Do not exercise so much that you hurt yourself, feel dizzy, or get very short of breath.  Before you start a new exercise program, talk with your health care provider. This information is not intended to replace advice given to you by your health care provider. Make sure you discuss any questions you have with your health care provider. Document Revised: 11/27/2017 Document Reviewed: 11/05/2017 Elsevier Patient Education  2020 Elsevier Inc.   Budget-Friendly Healthy Eating There are many ways to save money at the grocery store and continue to eat healthy. You can be successful if you:  Plan meals according to your budget.  Make a grocery list and only purchase food according to your grocery list.  Prepare food yourself. What are tips for following this plan?  Reading food labels  Compare food labels between brand name foods and the store brand. Often the nutritional value is the same, but the store brand is lower cost.  Look for products that do not have added sugar, fat, or salt (sodium). These often cost the same but are healthier for you. Products may be labeled as: ? Sugar-free. ? Nonfat. ? Low-fat. ? Sodium-free. ? Low-sodium.  Look for lean ground beef labeled as at least 92% lean and 8% fat. Shopping  Buy only the items on your grocery list and go only to the areas of the store that have the items on your list.  Use coupons only for foods  and brands you normally buy. Avoid buying items you wouldn't normally buy simply because they are on sale.  Check online and in newspapers for weekly deals.  Buy healthy items from the bulk bins when available, such as herbs, spices, flour, pasta, nuts, and dried fruit.  Buy fruits and vegetables that are in season. Prices are usually lower on in-season produce.  Look at the unit price on the price tag. Use it to compare different brands and sizes to find out which item is the best deal.  Choose healthy items that are often low-cost, such as carrots, potatoes, apples, bananas, and oranges. Dried or canned beans are a low-cost protein source.  Buy in bulk and freeze extra food. Items you can buy in bulk include meats, fish, poultry, frozen fruits, and frozen vegetables.  Avoid buying "ready-to-eat" foods, such as pre-cut fruits and vegetables and pre-made salads.  If possible, shop around to discover where you can find the best prices. Consider other retailers such as dollar stores, larger wholesale stores, local fruit and vegetable stands, and farmers markets.  Do not shop when you are hungry. If you shop while hungry, it may be hard to stick to your list and budget.  Resist impulse buying. Use your grocery list as   your official plan for the week.  Buy a variety of vegetables and fruits by purchasing fresh, frozen, and canned items.  Look at the top and bottom shelves for deals. Foods at eye level (eye level of an adult or child) are usually more expensive.  Be efficient with your time when shopping. The more time you spend at the store, the more money you are likely to spend.  To save money when choosing more expensive foods like meats and dairy: ? Choose cheaper cuts of meat, such as bone-in chicken thighs and drumsticks instead of skinless and boneless chicken. When you are ready to prepare the chicken, you can remove the skin yourself to make it healthier. ? Choose lean meats like  chicken or turkey instead of beef. ? Choose canned seafood, such as tuna, salmon, or sardines. ? Buy eggs as a low-cost source of protein. ? Buy dried beans and peas, such as lentils, split peas, or kidney beans instead of meats. Dried beans and peas are a good alternative source of protein. ? Buy the larger tubs of yogurt instead of individual-sized containers.  Choose water instead of sodas and other sweetened beverages.  Avoid buying chips, cookies, and other "junk food." These items are usually expensive and not healthy. Cooking  Make extra food and freeze the extras in meal-sized containers or in individual portions for fast meals and snacks.  Pre-cook on days when you have extra time to prepare meals in advance. You can keep these meals in the fridge or freezer and reheat for a quick meal.  When you come home from the grocery store, wash, peel, and cut fruits and vegetables so they are ready to use and eat. This will help reduce food waste. Meal planning  Do not eat out or get fast food. Prepare food at home.  Make a grocery list and make sure to bring it with you to the store. If you have a smart phone, you could use your phone to create your shopping list.  Plan meals and snacks according to a grocery list and budget you create.  Use leftovers in your meal plan for the week.  Look for recipes where you can cook once and make enough food for two meals.  Include budget-friendly meals like stews, casseroles, and stir-fry dishes.  Try some meatless meals or try "no cook" meals like salads.  Make sure that half your plate is filled with fruits or vegetables. Choose from fresh, frozen, or canned fruits and vegetables. If eating canned, remember to rinse them before eating. This will remove any excess salt added for packaging. Summary  Eating healthy on a budget is possible if you plan your meals according to your budget, purchase according to your budget and grocery list, and  prepare food yourself.  Tips for buying more food on a limited budget include buying generic brands, using coupons only for foods you normally buy, and buying healthy items from the bulk bins when available.  Tips for buying cheaper food to replace expensive food include choosing cheaper, lean cuts of meat, and buying dried beans and peas. This information is not intended to replace advice given to you by your health care provider. Make sure you discuss any questions you have with your health care provider. Document Revised: 12/16/2017 Document Reviewed: 12/16/2017 Elsevier Patient Education  2020 Elsevier Inc.   Bone Health Bones protect organs, store calcium, anchor muscles, and support the whole body. Keeping your bones strong is important, especially as you   get older. You can take actions to help keep your bones strong and healthy. Why is keeping my bones healthy important?  Keeping your bones healthy is important because your body constantly replaces bone cells. Cells get old, and new cells take their place. As we age, we lose bone cells because the body may not be able to make enough new cells to replace the old cells. The amount of bone cells and bone tissue you have is referred to as bone mass. The higher your bone mass, the stronger your bones. The aging process leads to an overall loss of bone mass in the body, which can increase the likelihood of:  Joint pain and stiffness.  Broken bones.  A condition in which the bones become weak and brittle (osteoporosis). A large decline in bone mass occurs in older adults. In women, it occurs about the time of menopause. What actions can I take to keep my bones healthy? Good health habits are important for maintaining healthy bones. This includes eating nutritious foods and exercising regularly. To have healthy bones, you need to get enough of the right minerals and vitamins. Most nutrition experts recommend getting these nutrients from the  foods that you eat. In some cases, taking supplements may also be recommended. Doing certain types of exercise is also important for bone health. What are the nutritional recommendations for healthy bones?  Eating a well-balanced diet with plenty of calcium and vitamin D will help to protect your bones. Nutritional recommendations vary from person to person. Ask your health care provider what is healthy for you. Here are some general guidelines. Get enough calcium Calcium is the most important (essential) mineral for bone health. Most people can get enough calcium from their diet, but supplements may be recommended for people who are at risk for osteoporosis. Good sources of calcium include:  Dairy products, such as low-fat or nonfat milk, cheese, and yogurt.  Dark green leafy vegetables, such as bok choy and broccoli.  Calcium-fortified foods, such as orange juice, cereal, bread, soy beverages, and tofu products.  Nuts, such as almonds. Follow these recommended amounts for daily calcium intake:  Children, age 1-3: 700 mg.  Children, age 4-8: 1,000 mg.  Children, age 9-13: 1,300 mg.  Teens, age 14-18: 1,300 mg.  Adults, age 19-50: 1,000 mg.  Adults, age 51-70: ? Men: 1,000 mg. ? Women: 1,200 mg.  Adults, age 71 or older: 1,200 mg.  Pregnant and breastfeeding females: ? Teens: 1,300 mg. ? Adults: 1,000 mg. Get enough vitamin D Vitamin D is the most essential vitamin for bone health. It helps the body absorb calcium. Sunlight stimulates the skin to make vitamin D, so be sure to get enough sunlight. If you live in a cold climate or you do not get outside often, your health care provider may recommend that you take vitamin D supplements. Good sources of vitamin D in your diet include:  Egg yolks.  Saltwater fish.  Milk and cereal fortified with vitamin D. Follow these recommended amounts for daily vitamin D intake:  Children and teens, age 1-18: 600 international  units.  Adults, age 50 or younger: 400-800 international units.  Adults, age 51 or older: 800-1,000 international units. Get other important nutrients Other nutrients that are important for bone health include:  Phosphorus. This mineral is found in meat, poultry, dairy foods, nuts, and legumes. The recommended daily intake for adult men and adult women is 700 mg.  Magnesium. This mineral is found in seeds, nuts, dark   green vegetables, and legumes. The recommended daily intake for adult men is 400-420 mg. For adult women, it is 310-320 mg.  Vitamin K. This vitamin is found in green leafy vegetables. The recommended daily intake is 120 mg for adult men and 90 mg for adult women. What type of physical activity is best for building and maintaining healthy bones? Weight-bearing and strength-building activities are important for building and maintaining healthy bones. Weight-bearing activities cause muscles and bones to work against gravity. Strength-building activities increase the strength of the muscles that support bones. Weight-bearing and muscle-building activities include:  Walking and hiking.  Jogging and running.  Dancing.  Gym exercises.  Lifting weights.  Tennis and racquetball.  Climbing stairs.  Aerobics. Adults should get at least 30 minutes of moderate physical activity on most days. Children should get at least 60 minutes of moderate physical activity on most days. Ask your health care provider what type of exercise is best for you. How can I find out if my bone mass is low? Bone mass can be measured with an X-ray test called a bone mineral density (BMD) test. This test is recommended for all women who are age 65 or older. It may also be recommended for:  Men who are age 70 or older.  People who are at risk for osteoporosis because of: ? Having bones that break easily. ? Having a long-term disease that weakens bones, such as kidney disease or rheumatoid  arthritis. ? Having menopause earlier than normal. ? Taking medicine that weakens bones, such as steroids, thyroid hormones, or hormone treatment for breast cancer or prostate cancer. ? Smoking. ? Drinking three or more alcoholic drinks a day. If you find that you have a low bone mass, you may be able to prevent osteoporosis or further bone loss by changing your diet and lifestyle. Where can I find more information? For more information, check out the following websites:  National Osteoporosis Foundation: www.nof.org/patients  National Institutes of Health: www.bones.nih.gov  International Osteoporosis Foundation: www.iofbonehealth.org Summary  The aging process leads to an overall loss of bone mass in the body, which can increase the likelihood of broken bones and osteoporosis.  Eating a well-balanced diet with plenty of calcium and vitamin D will help to protect your bones.  Weight-bearing and strength-building activities are also important for building and maintaining strong bones.  Bone mass can be measured with an X-ray test called a bone mineral density (BMD) test. This information is not intended to replace advice given to you by your health care provider. Make sure you discuss any questions you have with your health care provider. Document Revised: 01/11/2018 Document Reviewed: 01/11/2018 Elsevier Patient Education  2020 Elsevier Inc.   

## 2021-01-09 ENCOUNTER — Other Ambulatory Visit: Payer: Self-pay

## 2021-01-09 ENCOUNTER — Ambulatory Visit
Admission: RE | Admit: 2021-01-09 | Discharge: 2021-01-09 | Disposition: A | Discharge status: Home / Self Care | Payer: BC Managed Care – PPO | Source: Ambulatory Visit | Attending: Obstetrics and Gynecology | Admitting: Obstetrics and Gynecology

## 2021-01-09 DIAGNOSIS — Z1231 Encounter for screening mammogram for malignant neoplasm of breast: Secondary | ICD-10-CM | POA: Diagnosis not present

## 2021-10-28 DIAGNOSIS — M5387 Other specified dorsopathies, lumbosacral region: Secondary | ICD-10-CM | POA: Diagnosis not present

## 2021-10-28 DIAGNOSIS — M9901 Segmental and somatic dysfunction of cervical region: Secondary | ICD-10-CM | POA: Diagnosis not present

## 2021-10-28 DIAGNOSIS — M5383 Other specified dorsopathies, cervicothoracic region: Secondary | ICD-10-CM | POA: Diagnosis not present

## 2021-10-28 DIAGNOSIS — M9902 Segmental and somatic dysfunction of thoracic region: Secondary | ICD-10-CM | POA: Diagnosis not present

## 2021-10-29 DIAGNOSIS — M9901 Segmental and somatic dysfunction of cervical region: Secondary | ICD-10-CM | POA: Diagnosis not present

## 2021-10-29 DIAGNOSIS — M9902 Segmental and somatic dysfunction of thoracic region: Secondary | ICD-10-CM | POA: Diagnosis not present

## 2021-10-29 DIAGNOSIS — M5387 Other specified dorsopathies, lumbosacral region: Secondary | ICD-10-CM | POA: Diagnosis not present

## 2021-10-29 DIAGNOSIS — M5383 Other specified dorsopathies, cervicothoracic region: Secondary | ICD-10-CM | POA: Diagnosis not present

## 2021-11-05 DIAGNOSIS — M5387 Other specified dorsopathies, lumbosacral region: Secondary | ICD-10-CM | POA: Diagnosis not present

## 2021-11-05 DIAGNOSIS — M5383 Other specified dorsopathies, cervicothoracic region: Secondary | ICD-10-CM | POA: Diagnosis not present

## 2021-11-05 DIAGNOSIS — M9901 Segmental and somatic dysfunction of cervical region: Secondary | ICD-10-CM | POA: Diagnosis not present

## 2021-11-05 DIAGNOSIS — M9902 Segmental and somatic dysfunction of thoracic region: Secondary | ICD-10-CM | POA: Diagnosis not present

## 2021-11-12 DIAGNOSIS — M9902 Segmental and somatic dysfunction of thoracic region: Secondary | ICD-10-CM | POA: Diagnosis not present

## 2021-11-12 DIAGNOSIS — M9901 Segmental and somatic dysfunction of cervical region: Secondary | ICD-10-CM | POA: Diagnosis not present

## 2021-11-12 DIAGNOSIS — M5383 Other specified dorsopathies, cervicothoracic region: Secondary | ICD-10-CM | POA: Diagnosis not present

## 2021-11-12 DIAGNOSIS — M5387 Other specified dorsopathies, lumbosacral region: Secondary | ICD-10-CM | POA: Diagnosis not present

## 2022-02-11 ENCOUNTER — Other Ambulatory Visit: Payer: Self-pay

## 2022-02-11 ENCOUNTER — Ambulatory Visit: Payer: BC Managed Care – PPO | Admitting: Obstetrics and Gynecology

## 2022-02-11 ENCOUNTER — Encounter: Payer: Self-pay | Admitting: Obstetrics and Gynecology

## 2022-02-11 VITALS — BP 110/60 | Ht 61.0 in | Wt 229.0 lb

## 2022-02-11 DIAGNOSIS — F32A Depression, unspecified: Secondary | ICD-10-CM | POA: Diagnosis not present

## 2022-02-11 DIAGNOSIS — G4709 Other insomnia: Secondary | ICD-10-CM | POA: Diagnosis not present

## 2022-02-11 DIAGNOSIS — N951 Menopausal and female climacteric states: Secondary | ICD-10-CM

## 2022-02-11 DIAGNOSIS — F419 Anxiety disorder, unspecified: Secondary | ICD-10-CM | POA: Diagnosis not present

## 2022-02-11 MED ORDER — VENLAFAXINE HCL ER 75 MG PO CP24: 75.0000 mg | ORAL_CAPSULE | Freq: Every day | ORAL | 1 refills | Status: DC

## 2022-02-11 MED ORDER — LORAZEPAM 0.5 MG PO TABS: 0.5000 mg | ORAL_TABLET | Freq: Two times a day (BID) | ORAL | 0 refills | Status: DC | PRN

## 2022-02-11 NOTE — Progress Notes (Signed)
Kristin Lynch, CNM (Inactive)   Chief Complaint  Patient presents with   Hot Flashes    Insomnia, anxiety    HPI:      Kristin Lynch is a 56 y.o. [phone] whose LMP was No LMP recorded (lmp unknown). Patient has had a hysterectomy., presents today for anxiety sx with insomnia for past 7-8 months. Pt has always been a good sleeper but now having trouble falling asleep or staying asleep. Thinks sleep disturbance is increasing anxiety sx and if could relax/improve anxiety, then she could sleep better again. Has night sweats but not hot flashes. Pt is active, not exercising currently. Hasn't tried melatonin. Is not on any meds, hasn't done anxiety tx in past.   Mammo 01/09/21 Neg pap/neg HPV DNA 12/18; pt is s/p lap supracx hyst for painful periods. No PMB.  Annual due.    Patient Active Problem List   Diagnosis Date Noted   Colon cancer screening    Polyp of sigmoid colon    Benign neoplasm of descending colon    Family history of ovarian cancer 12/20/2017   Family history of breast cancer 12/20/2017   Morbid obesity ([place]) 12/20/2017    Past Surgical History:  Procedure Laterality Date   ABDOMINAL HYSTERECTOMY     partial   CESAREAN SECTION  1992/1997   x 2   CHOLECYSTECTOMY     COLONOSCOPY WITH PROPOFOL N/A 02/08/2018   Procedure: COLONOSCOPY WITH PROPOFOL;  Surgeon: Lucilla Lame, MD;  Location: [place];  Service: Endoscopy;  Laterality: N/A;. Hyperplastic polyps-repeat in 10 years   ENDOMETRIAL ABLATION  02/21/2011   novasure   GALLBLADDER SURGERY     LAPAROSCOPIC SUPRACERVICAL HYSTERECTOMY  2013   adenomyosis   POLYPECTOMY  02/08/2018   Procedure: POLYPECTOMY;  Surgeon: Lucilla Lame, MD;  Location: [place];  Service: Endoscopy;;   TONSILLECTOMY     TUBAL LIGATION      Family History  Problem Relation Age of Onset   Breast cancer Mother 93   Heart attack Father    Prostate cancer Brother    Colon cancer Maternal Aunt 60   Colon  cancer Maternal Aunt 80   Dementia Paternal Uncle    Ovarian cancer Paternal Grandmother 15    Social History   Socioeconomic History   Marital status: Divorced    Spouse name: Not on file   Number of children: 2   Years of education: Not on file   Highest education level: Not on file  Occupational History   Occupation: [employer]  Tobacco Use   Smoking status: Never   Smokeless tobacco: Never  Vaping Use   Vaping Use: Never used  Substance and Sexual Activity   Alcohol use: Yes    Comment: occasional 2 per month   Drug use: No   Sexual activity: Not Currently    Partners: Male    Birth control/protection: Surgical    Comment: Hysterectomy  Other Topics Concern   Not on file  Social History Narrative   Not on file   Social Determinants of Health   Financial Resource Strain: Not on file  Food Insecurity: Not on file  Transportation Needs: Not on file  Physical Activity: Not on file  Stress: Not on file  Social Connections: Not on file  Intimate Partner Violence: Not on file    Outpatient Medications Prior to Visit  Medication Sig Dispense Refill   loratadine (CLARITIN) 10 MG tablet Take by mouth.  fluticasone (FLONASE) 50 MCG/ACT nasal spray Place into both nostrils as needed.      No facility-administered medications prior to visit.      ROS:  Review of Systems  Constitutional:  Negative for fever.  Gastrointestinal:  Negative for blood in stool, constipation, diarrhea, nausea and vomiting.  Genitourinary:  Negative for dyspareunia, dysuria, flank pain, frequency, hematuria, urgency, vaginal bleeding, vaginal discharge and vaginal pain.  Musculoskeletal:  Negative for back pain.  Skin:  Negative for rash.  Psychiatric/Behavioral:  Positive for agitation and sleep disturbance.   BREAST: No symptoms   OBJECTIVE:   Vitals:  BP 110/60    Ht 5\' 1"  (1.549 m)    Wt 229 lb (103.9 kg)    LMP  (LMP Unknown)    BMI 43.27 kg/m   Physical Exam Vitals  reviewed.  Constitutional:      Appearance: She is well-developed.  Pulmonary:     Effort: Pulmonary effort is normal.  Musculoskeletal:        General: Normal range of motion.     Cervical back: Normal range of motion.  Skin:    General: Skin is warm and dry.  Neurological:     General: No focal deficit present.     Mental Status: She is alert and oriented to person, place, and time.     Cranial Nerves: No cranial nerve deficit.  Psychiatric:        Mood and Affect: Mood normal.        Behavior: Behavior normal.        Thought Content: Thought content normal.        Judgment: Judgment normal.    Results: GAD 7 : Generalized Anxiety Score 02/11/2022 11/26/2020  Nervous, Anxious, on Edge 3 3  Control/stop worrying 2 0  Worry too much - different things 3 0  Trouble relaxing 3 0  Restless 3 0  Easily annoyed or irritable 3 0  Afraid - awful might happen 1 0  Total GAD 7 Score 18 3  Anxiety Difficulty Somewhat difficult -    Depression screen [facility] 2/9 02/11/2022  Decreased Interest 2  Down, Depressed, Hopeless 1  PHQ - 2 Score 3  Altered sleeping 3  Tired, decreased energy 2  Change in appetite 1  Feeling bad or failure about yourself  1  Trouble concentrating 2  Moving slowly or fidgety/restless 3  Suicidal thoughts 0  PHQ-9 Score 15  Difficult doing work/chores Somewhat difficult      Assessment/Plan: Anxiety and depression - Plan: venlafaxine XR (EFFEXOR-XR) 75 MG 24 hr capsule, LORazepam (ATIVAN) 0.5 MG tablet; discussed tx with SNRI for both psych sx and vasomotor sx. Pt would like to try treatment. Needs something immediate in meantime for when she gets really anxious/frustrated. Try ativan prn, Rx eRxd; pt to use sparingly, aware of addictive potential. F/u in 6 wks with sx/sooner prn.   Vasomotor symptoms due to menopause - Plan: venlafaxine XR (EFFEXOR-XR) 75 MG 24 hr capsule; try effexor. Also discussed HRT but pt declines for now. F/u prn.   Other insomnia  - Plan: LORazepam (ATIVAN) 0.5 MG tablet; Rx ativan prn. Increase exercise in meantime, melatonin, sleep hygiene. See if sx improve with anxiety tx. Discussed sleep tx for anxiety vs anxiety tx for sleep. Will see how she is doing in 6 wks.     Meds ordered this encounter  Medications   venlafaxine XR (EFFEXOR-XR) 75 MG 24 hr capsule    Sig: Take 1 capsule (75  mg total) by mouth daily.    Dispense:  30 capsule    Refill:  1    Order Specific Question:   Supervising Provider    Answer:   Gae Dry [[id number]]   LORazepam (ATIVAN) 0.5 MG tablet    Sig: Take 1 tablet (0.5 mg total) by mouth 2 (two) times daily as needed for anxiety.    Dispense:  30 tablet    Refill:  0    Order Specific Question:   Supervising Provider    Answer:   Gae Dry [[id number]]      Return in about 6 weeks (around 03/25/2022) for annual/Rx f/u.  Kristin Mulroy B. Perlita Forbush, PA-C 02/11/2022 4:48 PM

## 2022-03-25 ENCOUNTER — Encounter: Payer: Self-pay | Admitting: Obstetrics and Gynecology

## 2022-03-25 ENCOUNTER — Other Ambulatory Visit: Payer: Self-pay

## 2022-03-25 ENCOUNTER — Other Ambulatory Visit (HOSPITAL_COMMUNITY)
Admission: RE | Admit: 2022-03-25 | Discharge: 2022-03-25 | Disposition: A | Discharge status: Home / Self Care | Payer: BC Managed Care – PPO | Source: Ambulatory Visit | Attending: Obstetrics and Gynecology | Admitting: Obstetrics and Gynecology

## 2022-03-25 ENCOUNTER — Ambulatory Visit (INDEPENDENT_AMBULATORY_CARE_PROVIDER_SITE_OTHER): Payer: BC Managed Care – PPO | Admitting: Obstetrics and Gynecology

## 2022-03-25 VITALS — BP 126/80 | Ht 61.0 in | Wt 224.0 lb

## 2022-03-25 DIAGNOSIS — F32A Depression, unspecified: Secondary | ICD-10-CM

## 2022-03-25 DIAGNOSIS — Z131 Encounter for screening for diabetes mellitus: Secondary | ICD-10-CM

## 2022-03-25 DIAGNOSIS — Z6841 Body Mass Index (BMI) 40.0 and over, adult: Secondary | ICD-10-CM | POA: Diagnosis not present

## 2022-03-25 DIAGNOSIS — Z1151 Encounter for screening for human papillomavirus (HPV): Secondary | ICD-10-CM | POA: Diagnosis not present

## 2022-03-25 DIAGNOSIS — Z13220 Encounter for screening for lipoid disorders: Secondary | ICD-10-CM

## 2022-03-25 DIAGNOSIS — Z124 Encounter for screening for malignant neoplasm of cervix: Secondary | ICD-10-CM

## 2022-03-25 DIAGNOSIS — Z1231 Encounter for screening mammogram for malignant neoplasm of breast: Secondary | ICD-10-CM | POA: Diagnosis not present

## 2022-03-25 DIAGNOSIS — Z01419 Encounter for gynecological examination (general) (routine) without abnormal findings: Secondary | ICD-10-CM | POA: Diagnosis not present

## 2022-03-25 DIAGNOSIS — Z0000 Encounter for general adult medical examination without abnormal findings: Secondary | ICD-10-CM

## 2022-03-25 DIAGNOSIS — F419 Anxiety disorder, unspecified: Secondary | ICD-10-CM

## 2022-03-25 DIAGNOSIS — G4709 Other insomnia: Secondary | ICD-10-CM

## 2022-03-25 DIAGNOSIS — N951 Menopausal and female climacteric states: Secondary | ICD-10-CM

## 2022-03-25 MED ORDER — VENLAFAXINE HCL ER 75 MG PO CP24: 75.0000 mg | ORAL_CAPSULE | Freq: Every day | ORAL | 3 refills | Status: DC

## 2022-03-25 NOTE — Progress Notes (Signed)
? ?PCP: Dalia Heading, CNM (Inactive) ? ? ?Chief Complaint  ?Patient presents with  ? Gynecologic Exam  ?  No concerns  ? ? ?HPI: ?     Ms. Kristin Lynch is a 56 y.o. [id number] whose LMP was No LMP recorded (lmp unknown). Patient has had a hysterectomy., presents today for her annual examination.  Her menses are absent due to lap supracx hyst for painful periods, no PMB. She does have vasomotor sx that are now improved/not as intense with effexor use, started 2/23. Doing well, want to continue Rx. She is also sleeping better and isn't as tired.   ? ?Sex activity: not sexually active. She does not have vaginal dryness. ? ?Last Pap: 12/18/17  Results were: no abnormalities /neg HPV DNA.  ? ?Last mammogram: 01/09/21  Results were: normal--routine follow-up in 12 months ?There is a FH of breast cancer in her mother. There is a FH of ovarian cancer in her PGM, prostate cancer in her brother, and colon cancer in 2 mat aunts. Pt is MyRisk neg 2018; IBIS=17%. The patient does not do self-breast exams. ? ?Colonoscopy: 2019 with polyp with Dr. Allen Norris; Repeat due after 10 years.  ? ?Tobacco use: The patient denies current or previous tobacco use. ?Alcohol use: none ?Exercise: moderately active ? ?She does get adequate calcium but not Vitamin D in her diet. ? ?No recent fasting labs.  ? ?Pt with anxiety sx with insomnia for past 7-8 months. Pt had always been a good sleeper but was having trouble falling asleep or staying asleep. Also Has night sweats but not hot flashes. Seen 2/23, started on effexor daily and ativan prn. Sleep and anxiety much improved, no side effects. Night sweats improved as well. Pt wants to continue effexor; only took ativan twice and doesn't need it.  ? ?Patient Active Problem List  ? Diagnosis Date Noted  ? Anxiety and depression 02/11/2022  ? Other insomnia 02/11/2022  ? Colon cancer screening   ? Polyp of sigmoid colon   ? Benign neoplasm of descending colon   ? Family history of ovarian cancer  12/20/2017  ? Family history of breast cancer 12/20/2017  ? Morbid obesity ([place]) 12/20/2017  ? ? ?Past Surgical History:  ?Procedure Laterality Date  ? ABDOMINAL HYSTERECTOMY    ? partial  ? CESAREAN SECTION  1992/1997  ? x 2  ? CHOLECYSTECTOMY    ? COLONOSCOPY WITH PROPOFOL N/A 02/08/2018  ? Procedure: COLONOSCOPY WITH PROPOFOL;  Surgeon: Lucilla Lame, MD;  Location: [facility];  Service: Endoscopy;  Laterality: N/A;. Hyperplastic polyps-repeat in 10 years  ? ENDOMETRIAL ABLATION  02/21/2011  ? novasure  ? GALLBLADDER SURGERY    ? LAPAROSCOPIC SUPRACERVICAL HYSTERECTOMY  2013  ? adenomyosis  ? POLYPECTOMY  02/08/2018  ? Procedure: POLYPECTOMY;  Surgeon: Lucilla Lame, MD;  Location: [facility];  Service: Endoscopy;;  ? TONSILLECTOMY    ? TUBAL LIGATION    ? ? ?Family History  ?Problem Relation Age of Onset  ? Breast cancer Mother 81  ? Heart attack Father   ? Prostate cancer Brother   ? Colon cancer Maternal Aunt 44  ? Colon cancer Maternal Aunt 80  ? Dementia Paternal Uncle   ? Ovarian cancer Paternal Grandmother 46  ? ? ?Social History  ? ?Socioeconomic History  ? Marital status: Divorced  ?  Spouse name: Not on file  ? Number of children: 2  ? Years of education: Not on file  ? Highest education level: Not  on file  ?Occupational History  ? Occupation: [employer]  ?Tobacco Use  ? Smoking status: Never  ? Smokeless tobacco: Never  ?Vaping Use  ? Vaping Use: Never used  ?Substance and Sexual Activity  ? Alcohol use: Yes  ?  Comment: occasional 2 per month  ? Drug use: No  ? Sexual activity: Not Currently  ?  Partners: Male  ?  Birth control/protection: Surgical  ?  Comment: Hysterectomy  ?Other Topics Concern  ? Not on file  ?Social History Narrative  ? Not on file  ? ?Social Determinants of Health  ? ?Financial Resource Strain: Not on file  ?Food Insecurity: Not on file  ?Transportation Needs: Not on file  ?Physical Activity: Not on file  ?Stress: Not on file  ?Social Connections: Not on file   ?Intimate Partner Violence: Not on file  ? ? ? ?Current Outpatient Medications:  ?  loratadine (CLARITIN) 10 MG tablet, Take by mouth., Disp: , Rfl:  ?  LORazepam (ATIVAN) 0.5 MG tablet, Take 1 tablet (0.5 mg total) by mouth 2 (two) times daily as needed for anxiety., Disp: 30 tablet, Rfl: 0 ?  venlafaxine XR (EFFEXOR-XR) 75 MG 24 hr capsule, Take 1 capsule (75 mg total) by mouth daily., Disp: 90 capsule, Rfl: 3 ? ? ? ? ?ROS: ? ?Review of Systems  ?Constitutional:  Negative for fatigue, fever and unexpected weight change.  ?Respiratory:  Negative for cough, shortness of breath and wheezing.   ?Cardiovascular:  Negative for chest pain, palpitations and leg swelling.  ?Gastrointestinal:  Positive for constipation and diarrhea. Negative for blood in stool, nausea and vomiting.  ?Endocrine: Negative for cold intolerance, heat intolerance and polyuria.  ?Genitourinary:  Negative for dyspareunia, dysuria, flank pain, frequency, genital sores, hematuria, menstrual problem, pelvic pain, urgency, vaginal bleeding, vaginal discharge and vaginal pain.  ?Musculoskeletal:  Negative for back pain, joint swelling and myalgias.  ?Skin:  Negative for rash.  ?Neurological:  Negative for dizziness, syncope, light-headedness, numbness and headaches.  ?Hematological:  Negative for adenopathy.  ?Psychiatric/Behavioral:  Positive for agitation. Negative for confusion, sleep disturbance and suicidal ideas. The patient is not nervous/anxious.   ?BREAST: No symptoms ? ? ? ?Objective: ?BP 126/80   Ht '5\' 1"'$  (1.549 m)   Wt 224 lb (101.6 kg)   LMP  (LMP Unknown)   BMI 42.32 kg/m?  ? ? ?Physical Exam ?Constitutional:   ?   Appearance: She is well-developed.  ?Genitourinary:  ?   Vulva normal.  ?   Genitourinary Comments: UTERUS SURG ABSENT  ?   Right Labia: No rash, tenderness or lesions. ?   Left Labia: No tenderness, lesions or rash. ?   No vaginal discharge, erythema or tenderness.  ? ?   Right Adnexa: not tender and no mass present. ?    Left Adnexa: not tender and no mass present. ?   No cervical friability or polyp.  ?   Uterus is not tender.  ?   Uterus is absent.  ?Breasts: ?   Right: No mass, nipple discharge, skin change or tenderness.  ?   Left: No mass, nipple discharge, skin change or tenderness.  ?Neck:  ?   Thyroid: No thyromegaly.  ?Cardiovascular:  ?   Rate and Rhythm: Normal rate and regular rhythm.  ?   Heart sounds: Normal heart sounds. No murmur heard. ?Pulmonary:  ?   Effort: Pulmonary effort is normal.  ?   Breath sounds: Normal breath sounds.  ?Abdominal:  ?   Palpations:  Abdomen is soft.  ?   Tenderness: There is no abdominal tenderness. There is no guarding or rebound.  ?Musculoskeletal:     ?   General: Normal range of motion.  ?   Cervical back: Normal range of motion.  ?Lymphadenopathy:  ?   Cervical: No cervical adenopathy.  ?Neurological:  ?   General: No focal deficit present.  ?   Mental Status: She is alert and oriented to person, place, and time.  ?   Cranial Nerves: No cranial nerve deficit.  ?Skin: ?   General: Skin is warm and dry.  ?Psychiatric:     ?   Mood and Affect: Mood normal.     ?   Behavior: Behavior normal.     ?   Thought Content: Thought content normal.     ?   Judgment: Judgment normal.  ?Vitals reviewed.  ? ? ?  03/25/2022  ? 11:53 AM 02/11/2022  ?  4:01 PM 11/26/2020  ?  2:36 PM  ?GAD 7 : Generalized Anxiety Score  ?Nervous, Anxious, on Edge '1 3 3  '$ ?Control/stop worrying 0 2 0  ?Worry too much - different things 0 3 0  ?Trouble relaxing 1 3 0  ?Restless 1 3 0  ?Easily annoyed or irritable 1 3 0  ?Afraid - awful might happen 0 1 0  ?Total GAD 7 Score '4 18 3  '$ ?Anxiety Difficulty Not difficult at all Somewhat difficult   ? ? ? ?  03/25/2022  ? 11:53 AM  ?Depression screen PHQ 2/9  ?Decreased Interest 1  ?Down, Depressed, Hopeless 0  ?PHQ - 2 Score 1  ?Altered sleeping 2  ?Tired, decreased energy 1  ?Change in appetite 0  ?Feeling bad or failure about yourself  0  ?Trouble concentrating 0  ?Moving slowly or  fidgety/restless 0  ?Suicidal thoughts 0  ?PHQ-9 Score 4  ?Difficult doing work/chores Somewhat difficult  ? ? ?Assessment/Plan: ? ?Encounter for annual routine gynecological examination ? ?Cervical cancer scre

## 2022-03-25 NOTE — Patient Instructions (Signed)
I value your feedback and you entrusting us with your care. If you get a [facility] patient survey, I would appreciate you taking the time to let us know about your experience today. Thank you!  [facility]: [phone]      

## 2022-03-26 LAB — COMPREHENSIVE METABOLIC PANEL
ALT: 10 IU/L (ref 0–32)
AST: 15 IU/L (ref 0–40)
Albumin/Globulin Ratio: 1.2 (ref 1.2–2.2)
Albumin: 3.9 g/dL (ref 3.8–4.9)
Alkaline Phosphatase: 77 IU/L (ref 44–121)
BUN/Creatinine Ratio: 19 (ref 9–23)
BUN: 14 mg/dL (ref 6–24)
Bilirubin Total: 0.3 mg/dL (ref 0.0–1.2)
CO2: 25 mmol/L (ref 20–29)
Calcium: 9.4 mg/dL (ref 8.7–10.2)
Chloride: 102 mmol/L (ref 96–106)
Creatinine, Ser: 0.73 mg/dL (ref 0.57–1.00)
Globulin, Total: 3.3 g/dL (ref 1.5–4.5)
Glucose: 78 mg/dL (ref 70–99)
Potassium: 4.4 mmol/L (ref 3.5–5.2)
Sodium: 138 mmol/L (ref 134–144)
Total Protein: 7.2 g/dL (ref 6.0–8.5)
eGFR: 97 mL/min/{1.73_m2} (ref >59)

## 2022-03-26 LAB — HEMOGLOBIN A1C
Est. average glucose Bld gHb Est-mCnc: 120 mg/dL
Hgb A1c MFr Bld: 5.8 % — ABNORMAL HIGH (ref 4.8–5.6)

## 2022-03-26 LAB — LIPID PANEL
Chol/HDL Ratio: 3.2 ratio (ref 0.0–4.4)
Cholesterol, Total: 229 mg/dL — ABNORMAL HIGH (ref 100–199)
HDL: 71 mg/dL (ref >39)
LDL Chol Calc (NIH): 147 mg/dL — ABNORMAL HIGH (ref 0–99)
Triglycerides: 63 mg/dL (ref 0–149)
VLDL Cholesterol Cal: 11 mg/dL (ref 5–40)

## 2022-03-28 LAB — CYTOLOGY - PAP
Comment: NEGATIVE
Diagnosis: NEGATIVE
High risk HPV: NEGATIVE

## 2022-04-29 ENCOUNTER — Ambulatory Visit
Admission: RE | Admit: 2022-04-29 | Discharge: 2022-04-29 | Disposition: A | Discharge status: Home / Self Care | Payer: BC Managed Care – PPO | Source: Ambulatory Visit | Attending: Obstetrics and Gynecology | Admitting: Obstetrics and Gynecology

## 2022-04-29 DIAGNOSIS — Z1231 Encounter for screening mammogram for malignant neoplasm of breast: Secondary | ICD-10-CM | POA: Insufficient documentation

## 2022-12-10 ENCOUNTER — Ambulatory Visit: Payer: BC Managed Care – PPO | Admitting: Podiatry

## 2022-12-10 ENCOUNTER — Ambulatory Visit (INDEPENDENT_AMBULATORY_CARE_PROVIDER_SITE_OTHER): Payer: BC Managed Care – PPO

## 2022-12-10 ENCOUNTER — Encounter: Payer: Self-pay | Admitting: Podiatry

## 2022-12-10 DIAGNOSIS — M7662 Achilles tendinitis, left leg: Secondary | ICD-10-CM

## 2022-12-10 DIAGNOSIS — M7661 Achilles tendinitis, right leg: Secondary | ICD-10-CM

## 2022-12-10 MED ORDER — DEXAMETHASONE SODIUM PHOSPHATE 120 MG/30ML IJ SOLN
4.0000 mg | Freq: Once | INTRAMUSCULAR | Status: AC
  Administered 2022-12-10: 4 mg via INTRA_ARTICULAR

## 2022-12-10 MED ORDER — METHYLPREDNISOLONE 4 MG PO TBPK: ORAL_TABLET | ORAL | 0 refills | Status: DC

## 2022-12-10 MED ORDER — MELOXICAM 15 MG PO TABS: 15.0000 mg | ORAL_TABLET | Freq: Every day | ORAL | 3 refills | Status: DC

## 2022-12-10 NOTE — Progress Notes (Signed)
Subjective:  Patient ID: Kristin Lynch, female    DOB: 1966/01/30,  MRN: [id number] HPI Chief Complaint  Patient presents with   Foot Pain    Posterior heel bilateral - aching x couple years, worsening, noticing a knot now, treated for achilles tendonitis by Dr. Cannon Kettle in 2017, went to PT-helped, but its back now, has a night splint-helps   New Patient (Initial Visit)    Est pt 2017    56 y.o. female presents with the above complaint.   ROS: Denies fever chills nausea vomit muscle aches pains calf pain back pain chest pain shortness of breath.  Past Medical History:  Diagnosis Date   Allergy    Anxiety 2015   BRCA negative 11/2017   MyRisk neg   Family history of breast cancer 11/2017   IBIS=17%   Family history of colon cancer 11/2017   MyRisk neg   Family history of ovarian cancer 11/2017   MyRisk neg   Tendonitis    Past Surgical History:  Procedure Laterality Date   ABDOMINAL HYSTERECTOMY     partial   CESAREAN SECTION  1992/1997   x 2   CHOLECYSTECTOMY     COLONOSCOPY WITH PROPOFOL N/A 02/08/2018   Procedure: COLONOSCOPY WITH PROPOFOL;  Surgeon: Lucilla Lame, MD;  Location: [facility];  Service: Endoscopy;  Laterality: N/A;. Hyperplastic polyps-repeat in 10 years   ENDOMETRIAL ABLATION  02/21/2011   novasure   GALLBLADDER SURGERY     LAPAROSCOPIC SUPRACERVICAL HYSTERECTOMY  2013   adenomyosis   POLYPECTOMY  02/08/2018   Procedure: POLYPECTOMY;  Surgeon: Lucilla Lame, MD;  Location: [facility];  Service: Endoscopy;;   TONSILLECTOMY     TUBAL LIGATION      Current Outpatient Medications:    meloxicam (MOBIC) 15 MG tablet, Take 1 tablet (15 mg total) by mouth daily., Disp: 30 tablet, Rfl: 3   methylPREDNISolone (MEDROL DOSEPAK) 4 MG TBPK tablet, 6 day dose pack - take as directed, Disp: 21 tablet, Rfl: 0   loratadine (CLARITIN) 10 MG tablet, Take by mouth., Disp: , Rfl:    LORazepam (ATIVAN) 0.5 MG tablet, Take 1 tablet (0.5 mg total) by  mouth 2 (two) times daily as needed for anxiety., Disp: 30 tablet, Rfl: 0   venlafaxine XR (EFFEXOR-XR) 75 MG 24 hr capsule, Take 1 capsule (75 mg total) by mouth daily., Disp: 90 capsule, Rfl: 3  No Known Allergies Review of Systems Objective:  There were no vitals filed for this visit.  General: Well developed, nourished, in no acute distress, alert and oriented x3   Dermatological: Skin is warm, dry and supple bilateral. Nails x 10 are well maintained; remaining integument appears unremarkable at this time. There are no open sores, no preulcerative lesions, no rash or signs of infection present.  Vascular: Dorsalis Pedis artery and Posterior Tibial artery pedal pulses are 2/4 bilateral with immedate capillary fill time. Pedal hair growth present. No varicosities and no lower extremity edema present bilateral.   Neruologic: Grossly intact via light touch bilateral. Vibratory intact via tuning fork bilateral. Protective threshold with Semmes Wienstein monofilament intact to all pedal sites bilateral. Patellar and Achilles deep tendon reflexes 2+ bilateral. No Babinski or clonus noted bilateral.   Musculoskeletal: No gross boney pedal deformities bilateral. No pain, crepitus, or limitation noted with foot and ankle range of motion bilateral. Muscular strength 5/5 in all groups tested bilateral.  She has tenderness and fluctuance along the posterior aspect of the Achilles as it inserts on  the posterior superior aspect of the calcaneus.  She also has osseous nodularity.  Gait: Unassisted, Nonantalgic.    Radiographs:  Radiographs taken today do demonstrate some retrocalcaneal heel spurs left greater than right soft tissue increase in density of the Achilles right greater than left  Assessment & Plan:   Assessment: Achilles tendinitis and bursitis with retrocalcaneal heel spurs  Plan: I injected dexamethasone and local anesthetic.  Started on methylprednisolone.  She has night splints at  home which she will start to utilize.  I will follow-up with her in a couple of weeks.     Kynnadi Dic[address] [place], [place]

## 2022-12-10 NOTE — Patient Instructions (Signed)

## 2023-01-14 ENCOUNTER — Ambulatory Visit: Payer: No Typology Code available for payment source | Admitting: Podiatry

## 2023-01-14 VITALS — BP 150/82 | HR 87

## 2023-01-14 DIAGNOSIS — S86011A Strain of right Achilles tendon, initial encounter: Secondary | ICD-10-CM | POA: Diagnosis not present

## 2023-01-14 DIAGNOSIS — M7662 Achilles tendinitis, left leg: Secondary | ICD-10-CM

## 2023-01-14 DIAGNOSIS — M7661 Achilles tendinitis, right leg: Secondary | ICD-10-CM

## 2023-01-14 NOTE — Progress Notes (Signed)
She presents today for follow-up of her Achilles tendinitis bilaterally.  This Kristin Lynch is hurting so bad again her.  She said she had to stop the meloxicam because of swelling in her feet.  Objective: Vital signs are stable she alert oriented x 3.  Pulses palpable.  Severe pain on palpation Achilles bilateral right greater than left.  Warm to the touch.  Assessment: Failure to improve with severe Achilles tendinitis right greater than left.  Plan: At this point we are requesting an MRI of the Achilles tendon possible rupture or tear of the Achilles at its insertion site.  All conservative therapies have failed to alleviate the patient's symptomatology follow-up with me once the MRI is complete.

## 2023-02-03 ENCOUNTER — Other Ambulatory Visit: Payer: BC Managed Care – PPO

## 2023-02-05 ENCOUNTER — Encounter: Payer: Self-pay | Admitting: Podiatry

## 2023-02-06 ENCOUNTER — Other Ambulatory Visit: Payer: BC Managed Care – PPO

## 2023-07-25 IMAGING — MG MM DIGITAL SCREENING BILAT W/ TOMO AND CAD
6 of 10 series · 6 of 30 positions shown · non-contrast
Comparison: Previous exam(s).

CLINICAL DATA: Screening.

EXAM:
DIGITAL SCREENING BILATERAL MAMMOGRAM WITH TOMOSYNTHESIS AND CAD
TECHNIQUE: Bilateral screening digital craniocaudal and mediolateral oblique
mammograms were obtained. Bilateral screening digital breast
tomosynthesis was performed. The images were evaluated with
computer-aided detection.

[L CC synth-2D]
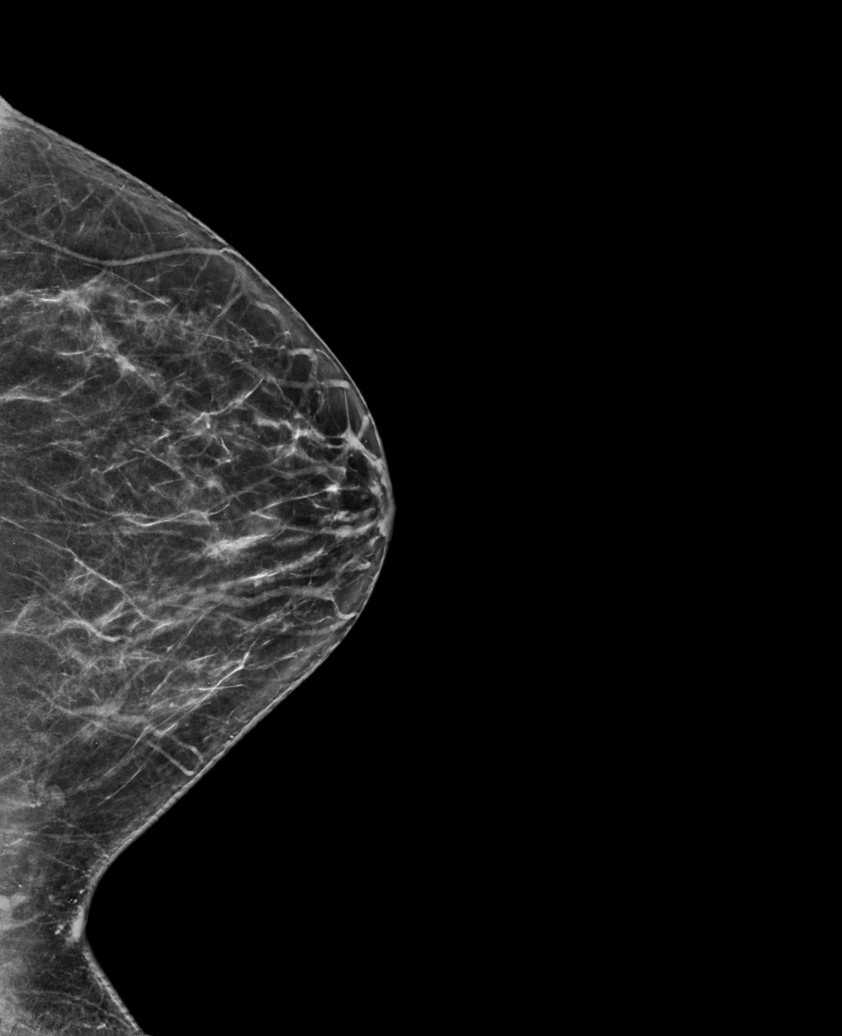

[L MLO synth-2D (1 of 2)]
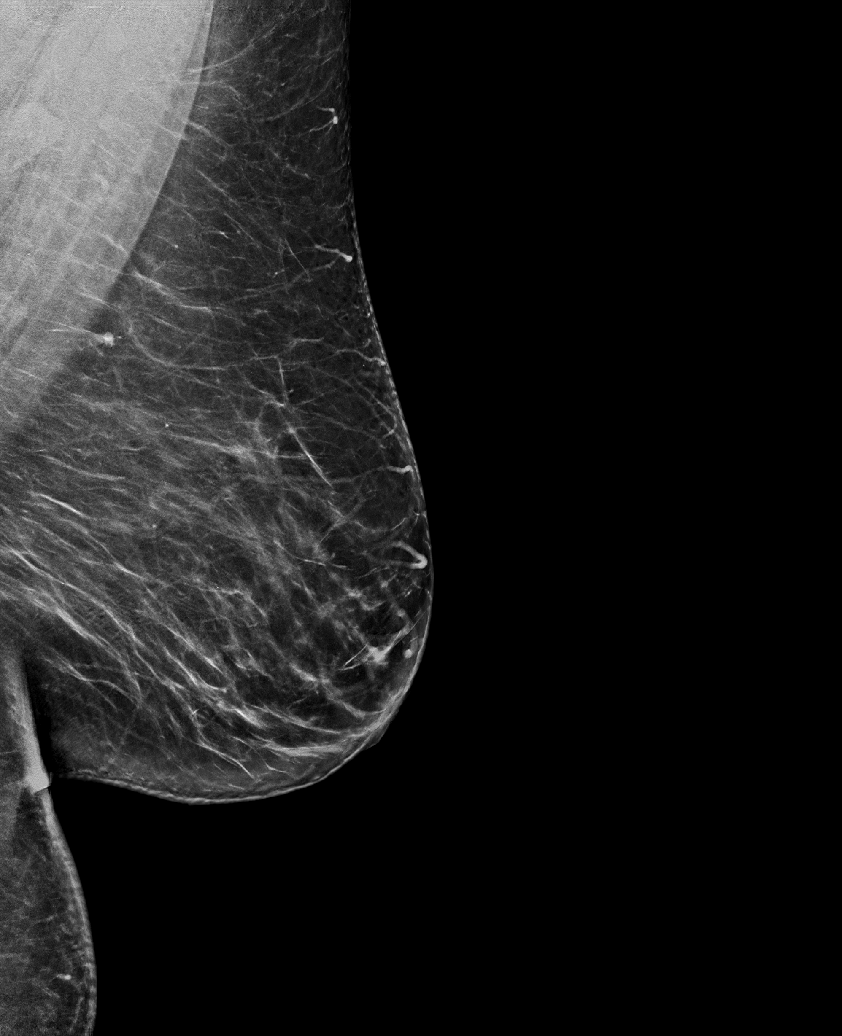

[L MLO synth-2D (2 of 2)]
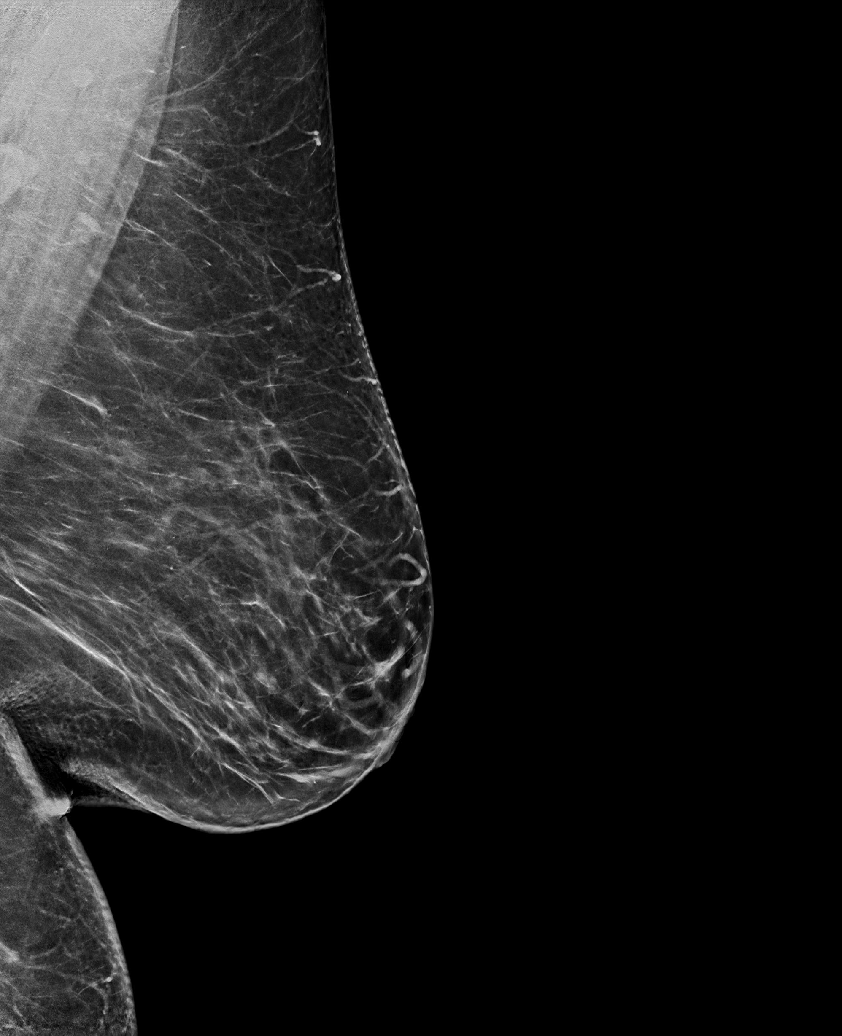

[R CC synth-2D]
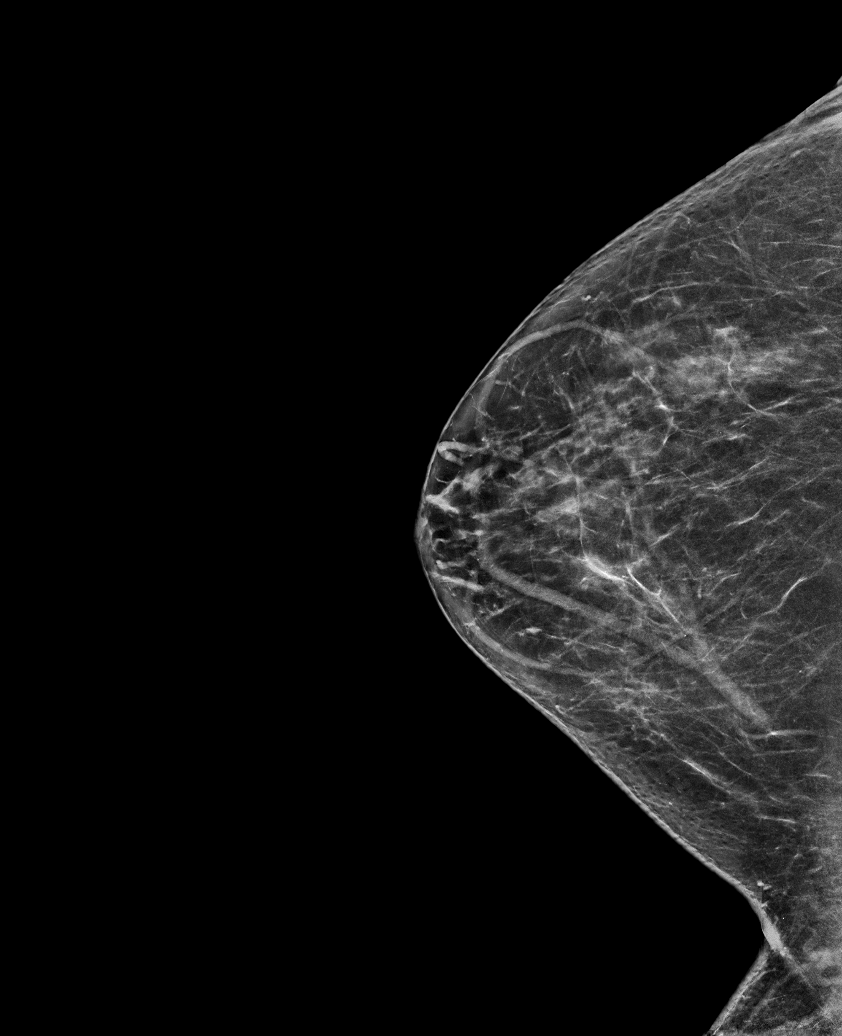

[R MLO synth-2D]
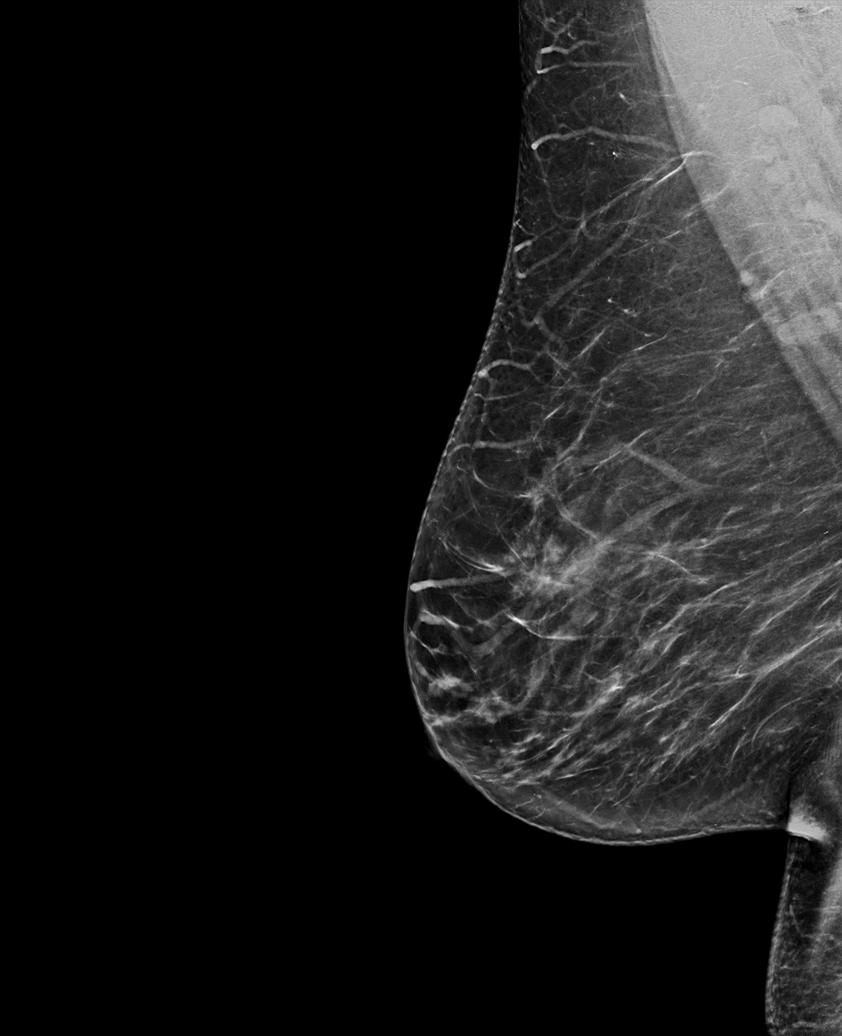

[L MLO tomo · tomo slice 45/88.0]
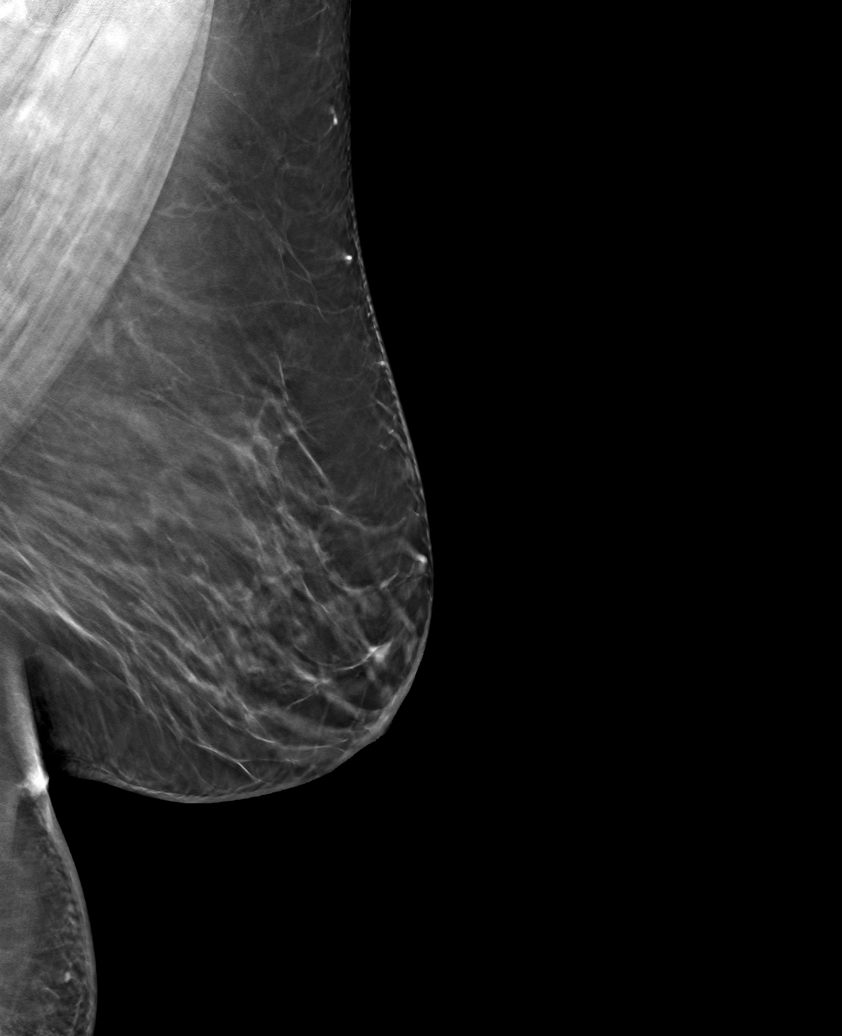

[6 of 30 positions shown; findings below may reference images not displayed]

ACR Breast Density Category b: There are scattered areas of
fibroglandular density.
FINDINGS: There are no findings suspicious for malignancy.
IMPRESSION: No mammographic evidence of malignancy. A result letter of this
screening mammogram will be mailed directly to the patient.

RECOMMENDATION:
Screening mammogram in one year. (Code:51-O-LD2)

BI-RADS CATEGORY  1: Negative.

## 2023-09-10 ENCOUNTER — Other Ambulatory Visit: Payer: Self-pay | Admitting: Obstetrics and Gynecology

## 2023-09-10 DIAGNOSIS — Z1231 Encounter for screening mammogram for malignant neoplasm of breast: Secondary | ICD-10-CM

## 2023-09-17 ENCOUNTER — Ambulatory Visit
Admission: RE | Admit: 2023-09-17 | Discharge: 2023-09-17 | Disposition: A | Discharge status: Home / Self Care | Payer: No Typology Code available for payment source | Source: Ambulatory Visit | Attending: Obstetrics and Gynecology | Admitting: Obstetrics and Gynecology

## 2023-09-17 DIAGNOSIS — Z1231 Encounter for screening mammogram for malignant neoplasm of breast: Secondary | ICD-10-CM | POA: Diagnosis present

## 2023-10-14 NOTE — Progress Notes (Unsigned)
PCP: Rica Records, PA-C   No chief complaint on file.   HPI:      Kristin Lynch is a 57 y.o. [id number] whose LMP was No LMP recorded (lmp unknown). Patient has had a hysterectomy., presents today for her annual examination.  Her menses are absent due to lap supracx hyst for painful periods, no PMB. She does have vasomotor sx that are now improved/not as intense with effexor use, started 2/23. Doing well, want to continue Rx. She is also sleeping better and isn't as tired.    Sex activity: not sexually active. She does not have vaginal dryness.  Last Pap: 03/25/22 Results were: no abnormalities /neg HPV DNA. Still has cx  Last mammogram: 09/17/23  Results were: normal--routine follow-up in 12 months There is a FH of breast cancer in her mother. There is a FH of ovarian cancer in her PGM, prostate cancer in her brother, and colon cancer in 2 mat aunts. Pt is MyRisk neg 2018; IBIS=17%. The patient does not do self-breast exams.  Colonoscopy: 2019 with polyp with Dr. Servando Snare; Repeat due after 10 years.   Tobacco use: The patient denies current or previous tobacco use. Alcohol use: none Exercise: moderately active  She does get adequate calcium but not Vitamin D in her diet.  Elevated lipids/pre-DM on 3/23 labs  Pt with anxiety sx with insomnia for past 7-8 months. Pt had always been a good sleeper but was having trouble falling asleep or staying asleep. Also Has night sweats but not hot flashes. Seen 2/23, started on effexor daily and ativan prn. Sleep and anxiety much improved, no side effects. Night sweats improved as well. Pt wants to continue effexor; only took ativan twice and doesn't need it.   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 Hyperplastic polyps-repeat in 10 years   ENDOMETRIAL ABLATION  02/21/2011   novasure   GALLBLADDER SURGERY     LAPAROSCOPIC SUPRACERVICAL HYSTERECTOMY  2013   adenomyosis   POLYPECTOMY  02/08/2018   Procedure: POLYPECTOMY;  Surgeon: Midge Minium, MD;  Location: [facility];  Service: Endoscopy;;   TONSILLECTOMY     TUBAL LIGATION      Family History  Problem Relation Age of Onset   Breast cancer Mother 23   Heart attack Father    Prostate cancer Brother    Colon cancer Maternal Aunt 60   Colon cancer Maternal Aunt 31   Dementia Paternal Uncle    Ovarian cancer Paternal Grandmother 28    Social History   Socioeconomic History   Marital status: Divorced    Spouse name: Not on file   Number of children: 2   Years of education: Not on file   Highest education level: Not on file  Occupational History  Occupation: Conservator, museum/gallery  Tobacco Use   Smoking status: Never   Smokeless tobacco: Never  Vaping Use   Vaping status: Never Used  Substance and Sexual Activity   Alcohol use: Yes    Comment: occasional 2 per month   Drug use: No   Sexual activity: Not Currently    Partners: Male    Birth control/protection: Surgical    Comment: Hysterectomy  Other Topics Concern   Not on file  Social History Narrative   Not on file   Social Determinants of Health   Financial Resource Strain: Not on file  Food Insecurity: Not on file  Transportation Needs: Not on file  Physical Activity: Inactive (12/18/2017)   Exercise Vital Sign    Days of Exercise per Week: 0  days    Minutes of Exercise per Session: 0 min  Stress: No Stress Concern Present (12/18/2017)   [employer] of Occupational Health - Occupational Stress Questionnaire    Feeling of Stress : Not at all  Social Connections: Somewhat Isolated (12/18/2017)   Social Connection and Isolation Panel [NHANES]    Frequency of Communication with Friends and Family: More than three times a week    Frequency of Social Gatherings with Friends and Family: Three times a week    Attends Religious Services: More than 4 times per year    Active Member of Clubs or Organizations: No    Attends Banker Meetings: Never    Marital Status: Divorced  Catering manager Violence: Not At Risk (12/18/2017)   Humiliation, Afraid, Rape, and Kick questionnaire    Fear of Current or Ex-Partner: No    Emotionally Abused: No    Physically Abused: No    Sexually Abused: No     Current Outpatient Medications:    loratadine (CLARITIN) 10 MG tablet, Take by mouth., Disp: , Rfl:    LORazepam (ATIVAN) 0.5 MG tablet, Take 1 tablet (0.5 mg total) by mouth 2 (two) times daily as needed for anxiety., Disp: 30 tablet, Rfl: 0   venlafaxine XR (EFFEXOR-XR) 75 MG 24 hr capsule, Take 1 capsule (75 mg total) by mouth daily., Disp: 90 capsule, Rfl: 3     ROS:  Review of Systems  Constitutional:  Negative for fatigue, fever and unexpected weight change.  Respiratory:  Negative for cough, shortness of breath and wheezing.   Cardiovascular:  Negative for chest pain, palpitations and leg swelling.  Gastrointestinal:  Positive for constipation and diarrhea. Negative for blood in stool, nausea and vomiting.  Endocrine: Negative for cold intolerance, heat intolerance and polyuria.  Genitourinary:  Negative for dyspareunia, dysuria, flank pain, frequency, genital sores, hematuria, menstrual problem, pelvic pain, urgency, vaginal bleeding, vaginal discharge and vaginal pain.  Musculoskeletal:  Negative for back pain,  joint swelling and myalgias.  Skin:  Negative for rash.  Neurological:  Negative for dizziness, syncope, light-headedness, numbness and headaches.  Hematological:  Negative for adenopathy.  Psychiatric/Behavioral:  Positive for agitation. Negative for confusion, sleep disturbance and suicidal ideas. The patient is not nervous/anxious.    BREAST: No symptoms    Objective: LMP  (LMP Unknown)    Physical Exam Constitutional:      Appearance: She is well-developed.  Genitourinary:     Vulva normal.     Genitourinary Comments: UTERUS SURG ABSENT     Right Labia: No rash, tenderness or lesions.    Left Labia: No tenderness, lesions or rash.    No vaginal discharge, erythema or tenderness.  Right Adnexa: not tender and no mass present.    Left Adnexa: not tender and no mass present.    No cervical friability or polyp.     Uterus is not tender.     Uterus is absent.  Breasts:    Right: No mass, nipple discharge, skin change or tenderness.     Left: No mass, nipple discharge, skin change or tenderness.  Neck:     Thyroid: No thyromegaly.  Cardiovascular:     Rate and Rhythm: Normal rate and regular rhythm.     Heart sounds: Normal heart sounds. No murmur heard. Pulmonary:     Effort: Pulmonary effort is normal.     Breath sounds: Normal breath sounds.  Abdominal:     Palpations: Abdomen is soft.     Tenderness: There is no abdominal tenderness. There is no guarding or rebound.  Musculoskeletal:        General: Normal range of motion.     Cervical back: Normal range of motion.  Lymphadenopathy:     Cervical: No cervical adenopathy.  Neurological:     General: No focal deficit present.     Mental Status: She is alert and oriented to person, place, and time.     Cranial Nerves: No cranial nerve deficit.  Skin:    General: Skin is warm and dry.  Psychiatric:        Mood and Affect: Mood normal.        Behavior: Behavior normal.        Thought Content: Thought content  normal.        Judgment: Judgment normal.  Vitals reviewed.       03/25/2022   11:53 AM 02/11/2022    4:01 PM 11/26/2020    2:36 PM  GAD 7 : Generalized Anxiety Score  Nervous, Anxious, on Edge 1 3 3   Control/stop worrying 0 2 0  Worry too much - different things 0 3 0  Trouble relaxing 1 3 0  Restless 1 3 0  Easily annoyed or irritable 1 3 0  Afraid - awful might happen 0 1 0  Total GAD 7 Score 4 18 3   Anxiety Difficulty Not difficult at all Somewhat difficult        03/25/2022   11:53 AM  Depression screen PHQ 2/9  Decreased Interest 1  Down, Depressed, Hopeless 0  PHQ - 2 Score 1  Altered sleeping 2  Tired, decreased energy 1  Change in appetite 0  Feeling bad or failure about yourself  0  Trouble concentrating 0  Moving slowly or fidgety/restless 0  Suicidal thoughts 0  PHQ-9 Score 4  Difficult doing work/chores Somewhat difficult    Assessment/Plan:  Encounter for annual routine gynecological examination  Cervical cancer screening - Plan: Cytology - PAP  Screening for HPV (human papillomavirus) - Plan: Cytology - PAP  Encounter for screening mammogram for malignant neoplasm of breast - Plan: MM 3D SCREEN BREAST BILATERAL; pt to schedule mammo  Anxiety and depression - Plan: venlafaxine XR (EFFEXOR-XR) 75 MG 24 hr capsule; sx improved, Rx RF.   Vasomotor symptoms due to menopause - Plan: venlafaxine XR (EFFEXOR-XR) 75 MG 24 hr capsule; sx improved with effexor. F/u prn.   Other insomnia--sx improved with effexor.  Blood tests for routine general physical examination - Plan: Comprehensive metabolic panel, Lipid panel, Hemoglobin A1c  Screening cholesterol level - Plan: Lipid panel  Screening for diabetes mellitus - Plan: Hemoglobin A1c  BMI 40.0-44.9, adult (HCC) - Plan:  Comprehensive metabolic panel, Lipid panel, Hemoglobin A1c   No orders of the defined types were placed in this encounter.           GYN counsel breast self exam, mammography  screening, menopause, adequate intake of calcium and vitamin D, diet and exercise    F/U  No follow-ups on file.  Kristin Blount B. Kingston Guiles, PA-C 10/14/2023 3:12 PM

## 2023-10-15 ENCOUNTER — Ambulatory Visit: Payer: Self-pay | Admitting: Obstetrics and Gynecology

## 2023-10-15 ENCOUNTER — Encounter: Payer: Self-pay | Admitting: Obstetrics and Gynecology

## 2023-10-15 VITALS — BP 117/71 | HR 72 | Ht 60.0 in | Wt 231.0 lb

## 2023-10-15 DIAGNOSIS — R7303 Prediabetes: Secondary | ICD-10-CM

## 2023-10-15 DIAGNOSIS — Z13220 Encounter for screening for lipoid disorders: Secondary | ICD-10-CM

## 2023-10-15 DIAGNOSIS — Z803 Family history of malignant neoplasm of breast: Secondary | ICD-10-CM

## 2023-10-15 DIAGNOSIS — F32A Depression, unspecified: Secondary | ICD-10-CM

## 2023-10-15 DIAGNOSIS — Z01419 Encounter for gynecological examination (general) (routine) without abnormal findings: Secondary | ICD-10-CM

## 2023-10-15 DIAGNOSIS — Z0000 Encounter for general adult medical examination without abnormal findings: Secondary | ICD-10-CM

## 2023-10-15 DIAGNOSIS — N951 Menopausal and female climacteric states: Secondary | ICD-10-CM

## 2023-10-15 DIAGNOSIS — Z1231 Encounter for screening mammogram for malignant neoplasm of breast: Secondary | ICD-10-CM

## 2023-10-15 MED ORDER — LORAZEPAM 0.5 MG PO TABS: 0.5000 mg | ORAL_TABLET | Freq: Two times a day (BID) | ORAL | 0 refills | Status: AC | PRN

## 2023-10-15 MED ORDER — VENLAFAXINE HCL ER 75 MG PO CP24: 75.0000 mg | ORAL_CAPSULE | Freq: Every day | ORAL | 1 refills | Status: DC

## 2023-10-15 NOTE — Patient Instructions (Signed)
I value your feedback and you entrusting us with your care. If you get a [facility] patient survey, I would appreciate you taking the time to let us know about your experience today. Thank you! ? ? ?

## 2023-12-02 NOTE — Progress Notes (Unsigned)
Jameis Newsham, Ilona Sorrel, PA-C   No chief complaint on file.   HPI:      Ms. Kristin Lynch is a 57 y.o. [id number] whose LMP was No LMP recorded (lmp unknown). 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 Hyperplastic polyps-repeat in 10 years   ENDOMETRIAL ABLATION  02/21/2011   novasure   GALLBLADDER SURGERY     LAPAROSCOPIC SUPRACERVICAL HYSTERECTOMY  2013   adenomyosis   POLYPECTOMY  02/08/2018   Procedure: POLYPECTOMY;  Surgeon: Midge Minium, MD;  Location: [facility];  Service: Endoscopy;;   TONSILLECTOMY     TUBAL LIGATION      Family History  Problem Relation Age of Onset   Breast cancer Mother 68   Heart attack Father    Prostate cancer Brother    Colon cancer Maternal Aunt 60   Colon cancer Maternal Aunt 13   Dementia Paternal Uncle    Ovarian cancer Paternal Grandmother 15    Social History   Socioeconomic History   Marital status: Divorced    Spouse name: Not on file   Number of children: 2   Years of education: Not on file   Highest education level: Not on file  Occupational History   Occupation: Conservator, museum/gallery  Tobacco Use   Smoking status: Never   Smokeless tobacco: Never  Vaping Use   Vaping status:  Never Used  Substance and Sexual Activity   Alcohol use: Yes    Comment: occasional 2 per month   Drug use: No   Sexual activity: Not Currently    Partners: Male    Birth control/protection: Surgical    Comment: Hysterectomy  Other Topics Concern   Not on file  Social History Narrative   Not on file   Social Determinants of Health   Financial Resource Strain: Not on file  Food Insecurity: Not on file  Transportation Needs: Not on file  Physical Activity: Inactive (12/18/2017)   Exercise Vital Sign    Days of Exercise per Week: 0 days    Minutes of Exercise per Session: 0 min  Stress: No Stress Concern Present (12/18/2017)   [employer] of Occupational Health - Occupational Stress Questionnaire    Feeling of Stress : Not at all  Social Connections: Somewhat Isolated (12/18/2017)   Social Connection and Isolation Panel [NHANES]    Frequency of Communication with Friends and Family: More than three times a week    Frequency of Social Gatherings with Friends and Family: Three times a week    Attends Religious Services: More than 4 times per year    Active Member of Clubs or Organizations: No    Attends Club or  Organization Meetings: Never    Marital Status: Divorced  Catering manager Violence: Not At Risk (12/18/2017)   Humiliation, Afraid, Rape, and Kick questionnaire    Fear of Current or Ex-Partner: No    Emotionally Abused: No    Physically Abused: No    Sexually Abused: No    Outpatient Medications Prior to Visit  Medication Sig Dispense Refill   loratadine (CLARITIN) 10 MG tablet Take by mouth.     LORazepam (ATIVAN) 0.5 MG tablet Take 1 tablet (0.5 mg total) by mouth 2 (two) times daily as needed for anxiety. 30 tablet 0   venlafaxine XR (EFFEXOR-XR) 75 MG 24 hr capsule Take 1 capsule (75 mg total) by mouth daily. 30 capsule 1   No facility-administered medications prior to visit.      ROS:  Review of Systems BREAST: No symptoms   OBJECTIVE:    Vitals:  LMP  (LMP Unknown)   Physical Exam  Results: No results found for this or any previous visit (from the past 24 hour(s)).   Assessment/Plan: No diagnosis found.    No orders of the defined types were placed in this encounter.     No follow-ups on file.  Miyah Hampshire B. Jaelynn Currier, PA-C 12/02/2023 2:35 PM

## 2023-12-03 ENCOUNTER — Other Ambulatory Visit: Payer: No Typology Code available for payment source

## 2023-12-03 ENCOUNTER — Encounter: Payer: Self-pay | Admitting: Obstetrics and Gynecology

## 2023-12-03 ENCOUNTER — Ambulatory Visit: Payer: No Typology Code available for payment source | Admitting: Obstetrics and Gynecology

## 2023-12-03 VITALS — BP 117/74 | HR 76 | Ht 60.0 in | Wt 229.0 lb

## 2023-12-03 DIAGNOSIS — R7303 Prediabetes: Secondary | ICD-10-CM

## 2023-12-03 DIAGNOSIS — F419 Anxiety disorder, unspecified: Secondary | ICD-10-CM | POA: Diagnosis not present

## 2023-12-03 DIAGNOSIS — F32A Depression, unspecified: Secondary | ICD-10-CM

## 2023-12-03 DIAGNOSIS — Z13220 Encounter for screening for lipoid disorders: Secondary | ICD-10-CM

## 2023-12-03 DIAGNOSIS — Z0000 Encounter for general adult medical examination without abnormal findings: Secondary | ICD-10-CM

## 2023-12-03 MED ORDER — VENLAFAXINE HCL ER 75 MG PO CP24: 75.0000 mg | ORAL_CAPSULE | Freq: Every day | ORAL | 2 refills | Status: AC

## 2023-12-03 NOTE — Patient Instructions (Signed)
I value your feedback and you entrusting us with your care. If you get a [facility] patient survey, I would appreciate you taking the time to let us know about your experience today. Thank you! ? ? ?

## 2023-12-04 LAB — COMPREHENSIVE METABOLIC PANEL
ALT: 12 [IU]/L (ref 0–32)
AST: 16 [IU]/L (ref 0–40)
Albumin: 3.7 g/dL — ABNORMAL LOW (ref 3.8–4.9)
Alkaline Phosphatase: 81 [IU]/L (ref 44–121)
BUN/Creatinine Ratio: 14 (ref 9–23)
BUN: 10 mg/dL (ref 6–24)
Bilirubin Total: 0.2 mg/dL (ref 0.0–1.2)
CO2: 22 mmol/L (ref 20–29)
Calcium: 9.1 mg/dL (ref 8.7–10.2)
Chloride: 101 mmol/L (ref 96–106)
Creatinine, Ser: 0.7 mg/dL (ref 0.57–1.00)
Globulin, Total: 3.1 g/dL (ref 1.5–4.5)
Glucose: 88 mg/dL (ref 70–99)
Potassium: 4.2 mmol/L (ref 3.5–5.2)
Sodium: 136 mmol/L (ref 134–144)
Total Protein: 6.8 g/dL (ref 6.0–8.5)
eGFR: 101 mL/min/{1.73_m2} (ref >59)

## 2023-12-04 LAB — LIPID PANEL
Chol/HDL Ratio: 3.1 {ratio} (ref 0.0–4.4)
Cholesterol, Total: 220 mg/dL — ABNORMAL HIGH (ref 100–199)
HDL: 70 mg/dL (ref >39)
LDL Chol Calc (NIH): 135 mg/dL — ABNORMAL HIGH (ref 0–99)
Triglycerides: 84 mg/dL (ref 0–149)
VLDL Cholesterol Cal: 15 mg/dL (ref 5–40)

## 2023-12-04 LAB — CBC WITH DIFFERENTIAL/PLATELET
Basophils Absolute: 0 10*3/uL (ref 0.0–0.2)
Basos: 1 %
EOS (ABSOLUTE): 0.1 10*3/uL (ref 0.0–0.4)
Eos: 2 %
Hematocrit: 42.9 % (ref 34.0–46.6)
Hemoglobin: 13.4 g/dL (ref 11.1–15.9)
Immature Grans (Abs): 0.1 10*3/uL (ref 0.0–0.1)
Immature Granulocytes: 1 %
Lymphocytes Absolute: 2.6 10*3/uL (ref 0.7–3.1)
Lymphs: 34 %
MCH: 27.6 pg (ref 26.6–33.0)
MCHC: 31.2 g/dL — ABNORMAL LOW (ref 31.5–35.7)
MCV: 88 fL (ref 79–97)
Monocytes Absolute: 0.6 10*3/uL (ref 0.1–0.9)
Monocytes: 8 %
Neutrophils Absolute: 4.3 10*3/uL (ref 1.4–7.0)
Neutrophils: 54 %
Platelets: 311 10*3/uL (ref 150–450)
RBC: 4.86 x10E6/uL (ref 3.77–5.28)
RDW: 12.9 % (ref 11.7–15.4)
WBC: 7.7 10*3/uL (ref 3.4–10.8)

## 2023-12-04 LAB — HEMOGLOBIN A1C
Est. average glucose Bld gHb Est-mCnc: 126 mg/dL
Hgb A1c MFr Bld: 6 % — ABNORMAL HIGH (ref 4.8–5.6)

## 2024-07-11 ENCOUNTER — Ambulatory Visit: Admitting: Family Medicine

## 2024-07-11 ENCOUNTER — Encounter: Payer: Self-pay | Admitting: Family Medicine

## 2024-07-11 ENCOUNTER — Ambulatory Visit
Admission: RE | Admit: 2024-07-11 | Discharge: 2024-07-11 | Disposition: A | Discharge status: Home / Self Care | Attending: *Deleted | Admitting: *Deleted

## 2024-07-11 ENCOUNTER — Ambulatory Visit
Admission: RE | Admit: 2024-07-11 | Discharge: 2024-07-11 | Disposition: A | Discharge status: Home / Self Care | Source: Ambulatory Visit | Attending: Family Medicine | Admitting: Family Medicine

## 2024-07-11 VITALS — BP 128/66 | HR 82 | Resp 16 | Ht 60.0 in | Wt 233.6 lb

## 2024-07-11 DIAGNOSIS — Z7689 Persons encountering health services in other specified circumstances: Secondary | ICD-10-CM | POA: Diagnosis not present

## 2024-07-11 DIAGNOSIS — R053 Chronic cough: Secondary | ICD-10-CM | POA: Insufficient documentation

## 2024-07-11 MED ORDER — AIRSUPRA 90-80 MCG/ACT IN AERO: 2.0000 | INHALATION_SPRAY | RESPIRATORY_TRACT | 1 refills | Status: AC | PRN

## 2024-07-11 NOTE — Patient Instructions (Addendum)
 [facility] at [facility] for chest x-ray  [address] [place], [place] [address] Phone: [phone]  Airsupra  is an albuterol mixed with a steroid- rinse your mouth out after every use.  Use as directed every 4-6 hours as needed for cough.    MyChart:  For all urgent or time sensitive needs we ask that you please call the office to avoid delays. Our number is [phone]) [phone]. MyChart is not constantly monitored and due to the large volume of messages a day, replies may take up to 72 business hours.   MyChart Policy: MyChart allows for you to see your visit notes, after visit summary, provider recommendations, lab and tests results, make an appointment, request refills, and contact your provider or the office for non-urgent questions or concerns. Providers are seeing patients during normal business hours and do not have built in time to review MyChart messages.  We ask that you allow a minimum of 3 business days for responses to [employer]. For this reason, please do not send urgent requests through MyChart. Please call the office at [phone]. New and ongoing conditions may require a visit. We have virtual and in person visit available for your convenience.  Complex MyChart concerns may require a visit. Your provider may request you schedule a virtual or in person visit to ensure we are providing the best care possible. MyChart messages sent after 11:00 AM on Friday will not be received by the provider until Monday morning.    Lab and Test Results: You will receive your lab and test results on MyChart as soon as they are completed and results have been sent by the lab or testing facility. Due to this service, you will receive your results BEFORE your provider.  I review lab and tests results each morning prior to seeing patients. Some results require collaboration with other providers to ensure you are receiving the most appropriate care. For  this reason, we ask that you please allow a minimum of 3-5 business days from the time the ALL results have been received for your provider to receive and review lab and test results and contact you about these.  Most lab and test result comments from the provider will be sent through MyChart. Your provider may recommend changes to the plan of care, follow-up visits, repeat testing, ask questions, or request an office visit to discuss these results. You may reply directly to this message or call the office at [phone] to provide information for the provider or set up an appointment. In some instances, you will be called with test results and recommendations. Please let us  know if this is preferred and we will make note of this in your chart to provide this for you.    If you have not heard a response to your lab or test results in 5 business days from all results returning to MyChart, please call the office to let us  know. We ask that you please avoid calling prior to this time unless there is an emergent concern. Due to high call volumes, this can delay the resulting process.   After Hours: For all non-emergency after hours needs, please call the office at [phone] and select the option to reach the on-call provider service. On-call services are shared between multiple [facility] offices and therefore it will not be possible to speak directly with your provider. On-call providers may provide medical advice and recommendations, but are unable to provide refills for maintenance medications.  For all emergency or urgent medical needs after normal business hours, we recommend that you seek care at the closest Urgent Care or Emergency Department to ensure appropriate treatment in a timely manner.  [facility] [place] has a 24 hour emergency room located on the ground floor for your convenience.    Urgent Concerns During the Business Day Providers are seeing patients from 8AM to 5PM,  Monday through Thursday, and 8AM to 12PM on Friday with a busy schedule and are most often not able to respond to non-urgent calls until the end of the day or the next business day. If you should have URGENT concerns during the day, please call and speak to the nurse or schedule a same day appointment so that we can address your concern without delay.    Thank you, again, for choosing me as your health care partner. I appreciate your trust and look forward to learning more about you.    Evalene Arts, FNP-C

## 2024-07-11 NOTE — Progress Notes (Signed)
 New Patient Office Visit  Subjective   Patient ID: Kristin Lynch, female    DOB: [birth year]  Age: 58 y.o. MRN: [id number]  CC:  Chief Complaint  Patient presents with   Cough    Started in April-Had ENT appt and prednisone did not help. UC gave cough syrup and antibiotic.    HPI Kristin Lynch is a 58 year old female who presents to establish with [facility] at [facility].   CC: Patient here to establish care  Last PCP: none  Specialists: OB/GYN  PMHx: Eagle's syndrome   Cough: saw ENT 12/2018, was diagnosed with Eagle's syndrome   Most recently, went to [facility] for ongoing cough about 2 weeks ago. She reports this cough at the visit (5/31) has been going on for 6 weeks.  Prior to this visit, saw her doctor and was placed on prednisone pack- reports that the coughing was there but not as bad At urgent care visit, she was coughing every breath she was taking. Placed on abx- cough did not improve.    Mostly dry, sporadic.  Chronic postnasal drip. Am I coughing to clear my throat or is mucus causing the cough? Denies sinus pain/pressure, headache, fever/chills, fatigue, burning sensation, shortness of breath, wheezing, chest pain, sore throat.   She has been using OTC nasal spray antihistamine- Nasacort  & Astepro. Claritin/Zyrtec daily.  She has a friend that lives in a rundown trailer and when she visits, her coughing worsens- she has issues prior to visiting her (needs to take benadryl)- cannot stay more than 2 hours.  No allergies with cats or dogs. She has air purifiers at her house.   In the morning, the cough is worst. Reports it is non-productive.  Cetrizine (Zyrtec)- takes it sparingly because it causes sedation for her.  Has been taking Prevacid- she is on her 12th day. She did miss a dose and did feel like the phlegm was heavier.  Her daddy was a smoker growing up- history of tonsillitis & acute sinus infections. Denies childhood history of asthma. She  reports seasonal allergies, before July she is done dealing with seasonal allergies.  Outpatient Encounter Medications as of 07/11/2024  Medication Sig   Albuterol-Budesonide (AIRSUPRA ) 90-80 MCG/ACT AERO Inhale 2 Inhalations into the lungs every 4 (four) hours as needed.   loratadine (CLARITIN) 10 MG tablet Take by mouth.   LORazepam  (ATIVAN ) 0.5 MG tablet Take 1 tablet (0.5 mg total) by mouth 2 (two) times daily as needed for anxiety.   venlafaxine  XR (EFFEXOR -XR) 75 MG 24 hr capsule Take 1 capsule (75 mg total) by mouth daily.   [DISCONTINUED] amoxicillin -clavulanate (AUGMENTIN ) 875-125 MG tablet SMARTSIG:1 Tablet(s) By Mouth Morning-Evening (Patient not taking: Reported on 07/11/2024)   [DISCONTINUED] HYDROcodone bit-homatropine (HYCODAN) 5-1.5 MG/5ML syrup Take 5 mLs by mouth 3 (three) times daily as needed. (Patient not taking: Reported on 07/11/2024)   No facility-administered encounter medications on file as of 07/11/2024.    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   Colon cancer screening    Polyp of sigmoid colon    Benign neoplasm of descending colon    Family history of ovarian cancer 12/20/2017   Family history of breast cancer 12/20/2017   Morbid obesity (HCC) 12/20/2017   Past Medical History:  Diagnosis Date   Allergy    Anxiety 2015   BRCA negative 11/2017   MyRisk neg  Family history of breast cancer 11/2017   IBIS=17%   Family history of colon cancer 11/2017   MyRisk neg   Family history of ovarian cancer 11/2017   MyRisk neg   GERD (gastroesophageal reflux disease)    Occasionally   Tendonitis    Past Surgical History:  Procedure Laterality Date   ABDOMINAL HYSTERECTOMY     partial   CESAREAN SECTION  1992/1997   x 2   CHOLECYSTECTOMY     COLONOSCOPY WITH PROPOFOL  N/A 02/08/2018   Procedure: COLONOSCOPY WITH PROPOFOL ;  Surgeon: Jinny Carmine, MD;   Location[facility];  Service: Endoscopy;  Laterality: N/A;. Hyperplastic polyps-repeat in 10 years   ENDOMETRIAL ABLATION  02/21/2011   novasure   GALLBLADDER SURGERY     LAPAROSCOPIC SUPRACERVICAL HYSTERECTOMY  2013   adenomyosis   POLYPECTOMY  02/08/2018   Procedure: POLYPECTOMY;  Surgeon: Jinny Carmine, MD;  Location: [facility];  Service: Endoscopy;;   TONSILLECTOMY     TUBAL LIGATION     Family History  Problem Relation Age of Onset   Breast cancer Mother 83   Hearing loss Mother    Heart attack Father    Hypertension Father    Varicose Veins Father    Prostate cancer Brother    Colon cancer Maternal Aunt 60   Colon cancer Maternal Aunt 42   Dementia Paternal Uncle    Ovarian cancer Paternal Grandmother 35   Diabetes Paternal Grandmother    Social History   Socioeconomic History   Marital status: Divorced    Spouse name: Not on file   Number of children: 2   Years of education: Not on file   Highest education level: Associate degree: occupational, Scientist, product/process development, or vocational program  Occupational History   Occupation: Conservator, museum/gallery  Tobacco Use   Smoking status: Never   Smokeless tobacco: Never  Vaping Use   Vaping status: Never Used  Substance and Sexual Activity   Alcohol use: Yes    Alcohol/week: 1.0 standard drink of alcohol    Types: 1 Standard drinks or equivalent per week    Comment: If i go out to dinner   Drug use: No   Sexual activity: Not Currently    Partners: Male    Birth control/protection: Abstinence    Comment: Hysterectomy  Other Topics Concern   Not on file  Social History Narrative   Not on file   Social Drivers of Health   Financial Resource Strain: Low Risk  (07/07/2024)   Overall Financial Resource Strain (CARDIA)    Difficulty of Paying Living Expenses: Not very hard  Food Insecurity: No Food Insecurity (07/07/2024)   Hunger Vital Sign    Worried About Running Out of Food in the Last Year: Never true    Ran Out of  Food in the Last Year: Never true  Transportation Needs: No Transportation Needs (07/07/2024)   PRAPARE - Administrator, Civil Service (Medical): No    Lack of Transportation (Non-Medical): No  Physical Activity: Insufficiently Active (07/07/2024)   Exercise Vital Sign    Days of Exercise per Week: 1 day    Minutes of Exercise per Session: 130 min  Stress: No Stress Concern Present (07/07/2024)   [employer] of Occupational Health - Occupational Stress Questionnaire    Feeling of Stress: Only a little  Social Connections: Moderately Isolated (07/07/2024)   Social Connection and Isolation Panel    Frequency of Communication with Friends and Family: More than three times a  week    Frequency of Social Gatherings with Friends and Family: Three times a week    Attends Religious Services: More than 4 times per year    Active Member of Clubs or Organizations: No    Attends Banker Meetings: Not on file    Marital Status: Divorced  Intimate Partner Violence: Not At Risk (12/18/2017)   Humiliation, Afraid, Rape, and Kick questionnaire    Fear of Current or Ex-Partner: No    Emotionally Abused: No    Physically Abused: No    Sexually Abused: No   Outpatient Medications Prior to Visit  Medication Sig Dispense Refill   loratadine (CLARITIN) 10 MG tablet Take by mouth.     LORazepam  (ATIVAN ) 0.5 MG tablet Take 1 tablet (0.5 mg total) by mouth 2 (two) times daily as needed for anxiety. 30 tablet 0   venlafaxine  XR (EFFEXOR -XR) 75 MG 24 hr capsule Take 1 capsule (75 mg total) by mouth daily. 90 capsule 2   amoxicillin -clavulanate (AUGMENTIN ) 875-125 MG tablet SMARTSIG:1 Tablet(s) By Mouth Morning-Evening (Patient not taking: Reported on 07/11/2024)     HYDROcodone bit-homatropine (HYCODAN) 5-1.5 MG/5ML syrup Take 5 mLs by mouth 3 (three) times daily as needed. (Patient not taking: Reported on 07/11/2024)     No facility-administered medications prior to visit.    Allergies  Allergen Reactions   Meloxicam  Swelling    Lower extremity swelling, joint pain, increased BP   Niacin Itching   ROS: see HPI    Objective  Today's Vitals   07/11/24 1349  BP: 128/66  Pulse: 82  Resp: 16  SpO2: 99%  Weight: 233 lb 9.6 oz (106 kg)  Height: 5' (1.524 m)  PainSc: 0-No pain   Physical Exam Vitals reviewed.  Constitutional:      General: She is not in acute distress.    Appearance: Normal appearance. She is not ill-appearing.  HENT:     Right Ear: Tympanic membrane, ear canal and external ear normal.     Left Ear: Tympanic membrane, ear canal and external ear normal.     Nose: Congestion and rhinorrhea present.     Right Turbinates: Enlarged, swollen and pale.     Left Turbinates: Enlarged, swollen and pale.     Right Sinus: No maxillary sinus tenderness or frontal sinus tenderness.     Left Sinus: No maxillary sinus tenderness or frontal sinus tenderness.     Mouth/Throat:     Mouth: Mucous membranes are moist.     Pharynx: Oropharynx is clear. Postnasal drip present. No oropharyngeal exudate or posterior oropharyngeal erythema.     Tonsils: No tonsillar exudate or tonsillar abscesses.  Eyes:     Conjunctiva/sclera: Conjunctivae normal.  Cardiovascular:     Rate and Rhythm: Normal rate and regular rhythm.     Pulses: Normal pulses.     Heart sounds: Normal heart sounds.  Pulmonary:     Effort: Pulmonary effort is normal.     Breath sounds: Normal breath sounds.  Neurological:     Mental Status: She is alert.  Psychiatric:        Mood and Affect: Mood normal.        Behavior: Behavior normal.        Assessment & Plan:   1. Encounter to establish care (Primary) Patient is a pleasant 1- year-old female who presents today to establish care with primary care at [facility]. Reviewed the past medical history, family history, social history, surgical history, medications and allergies today-  updates made as indicated. Patient has concerns  today about ongoing cough.    2. Chronic cough Patient presents today with frequent, non-productive cough. Patient in no acute distress and is well-appearing. Oxygen saturation is normal. Denies sinus pain/pressure, headache, fever/chills, fatigue, burning sensation in mid-abdomen, shortness of breath, wheezing, chest pain, sore throat, and dyspnea. Cardiovascular exam with heart regular rate and rhythm. Normal heart sounds, no murmurs present. No lower extremity edema present. Lungs clear to auscultation bilaterally. No adventitious lung sounds present. Discussed unclear etiology- could be due to chronic sinusitis with postnasal drip, adult-onset asthma, uncontrolled GERD, or chronic bronchitis. Will obtain CXR due to duration of cough. Reasonable to have patient complete pulmonary function testing with pulmonology. Referral placed. Discussed use of Airsupra  inhaler PRN for chronic cough. Will update patient with CXR results.  - Albuterol-Budesonide (AIRSUPRA ) 90-80 MCG/ACT AERO; Inhale 2 Inhalations into the lungs every 4 (four) hours as needed.  Dispense: 5.9 g; Refill: 1 - DG Chest 2 View; Future - Ambulatory referral to Pulmonology   Return in about 4 weeks (around 08/08/2024) for cough.   Evalene Arts, FNP

## 2024-07-12 ENCOUNTER — Ambulatory Visit: Payer: Self-pay | Admitting: Family Medicine

## 2024-08-08 ENCOUNTER — Ambulatory Visit: Admitting: Family Medicine

## 2024-08-30 ENCOUNTER — Ambulatory Visit: Admitting: Student in an Organized Health Care Education/Training Program

## 2024-10-26 ENCOUNTER — Other Ambulatory Visit: Payer: Self-pay | Admitting: Obstetrics and Gynecology

## 2024-10-26 DIAGNOSIS — Z1231 Encounter for screening mammogram for malignant neoplasm of breast: Secondary | ICD-10-CM

## 2024-11-30 ENCOUNTER — Ambulatory Visit
Admission: RE | Admit: 2024-11-30 | Discharge: 2024-11-30 | Disposition: A | Source: Ambulatory Visit | Attending: Obstetrics and Gynecology

## 2024-11-30 DIAGNOSIS — Z1231 Encounter for screening mammogram for malignant neoplasm of breast: Secondary | ICD-10-CM | POA: Insufficient documentation

## 2024-12-06 ENCOUNTER — Ambulatory Visit: Payer: Self-pay | Admitting: Obstetrics and Gynecology
